# Patient Record
Sex: Female | Born: 1970 | Race: Black or African American | Hispanic: No | Smoking: Never smoker
Health system: Southern US, Community
[De-identification: ages and names within clinical notes are randomized; demographics above are authoritative.]

## PROBLEM LIST (undated history)

## (undated) DIAGNOSIS — T7840XA Allergy, unspecified, initial encounter: Secondary | ICD-10-CM

## (undated) DIAGNOSIS — D649 Anemia, unspecified: Secondary | ICD-10-CM

## (undated) HISTORY — PX: BUNIONECTOMY: SHX129

## (undated) HISTORY — DX: Anemia, unspecified: D64.9

## (undated) HISTORY — DX: Allergy, unspecified, initial encounter: T78.40XA

---

## 1998-07-21 ENCOUNTER — Other Ambulatory Visit: Admission: RE | Admit: 1998-07-21 | Discharge: 1998-07-21 | Payer: Self-pay | Admitting: Obstetrics and Gynecology

## 1999-11-01 ENCOUNTER — Other Ambulatory Visit: Admission: RE | Admit: 1999-11-01 | Discharge: 1999-11-01 | Payer: Self-pay | Admitting: *Deleted

## 2002-05-28 ENCOUNTER — Other Ambulatory Visit: Admission: RE | Admit: 2002-05-28 | Discharge: 2002-05-28 | Payer: Self-pay | Admitting: Obstetrics and Gynecology

## 2003-11-08 ENCOUNTER — Inpatient Hospital Stay (HOSPITAL_COMMUNITY): Admission: AD | Admit: 2003-11-08 | Discharge: 2003-11-11 | Payer: Self-pay | Admitting: Obstetrics and Gynecology

## 2003-12-26 ENCOUNTER — Other Ambulatory Visit: Admission: RE | Admit: 2003-12-26 | Discharge: 2003-12-26 | Payer: Self-pay | Admitting: Obstetrics and Gynecology

## 2005-02-07 ENCOUNTER — Inpatient Hospital Stay (HOSPITAL_COMMUNITY): Admission: RE | Admit: 2005-02-07 | Discharge: 2005-02-11 | Payer: Self-pay | Admitting: Obstetrics and Gynecology

## 2005-03-24 ENCOUNTER — Other Ambulatory Visit: Admission: RE | Admit: 2005-03-24 | Discharge: 2005-03-24 | Payer: Self-pay | Admitting: Obstetrics and Gynecology

## 2010-10-19 ENCOUNTER — Other Ambulatory Visit: Payer: Self-pay | Admitting: Family Medicine

## 2010-10-19 DIAGNOSIS — Z1231 Encounter for screening mammogram for malignant neoplasm of breast: Secondary | ICD-10-CM

## 2010-10-27 ENCOUNTER — Ambulatory Visit: Payer: Self-pay

## 2010-10-27 ENCOUNTER — Ambulatory Visit
Admission: RE | Admit: 2010-10-27 | Discharge: 2010-10-27 | Disposition: A | Discharge status: Home / Self Care | Payer: PRIVATE HEALTH INSURANCE | Source: Ambulatory Visit | Attending: Family Medicine | Admitting: Family Medicine

## 2010-10-27 DIAGNOSIS — Z1231 Encounter for screening mammogram for malignant neoplasm of breast: Secondary | ICD-10-CM

## 2012-08-31 ENCOUNTER — Other Ambulatory Visit: Payer: Self-pay

## 2012-10-02 ENCOUNTER — Encounter: Payer: Self-pay | Admitting: Family Medicine

## 2012-10-02 ENCOUNTER — Ambulatory Visit (INDEPENDENT_AMBULATORY_CARE_PROVIDER_SITE_OTHER): Payer: BC Managed Care – PPO | Admitting: Family Medicine

## 2012-10-02 VITALS — BP 120/76 | HR 80 | Temp 98.1°F | Resp 16 | Ht 64.0 in | Wt 174.0 lb

## 2012-10-02 DIAGNOSIS — E559 Vitamin D deficiency, unspecified: Secondary | ICD-10-CM

## 2012-10-02 DIAGNOSIS — Z1231 Encounter for screening mammogram for malignant neoplasm of breast: Secondary | ICD-10-CM

## 2012-10-02 DIAGNOSIS — Z0000 Encounter for general adult medical examination without abnormal findings: Secondary | ICD-10-CM

## 2012-10-02 LAB — POCT UA - MICROSCOPIC ONLY
Casts, Ur, LPF, POC: NEGATIVE
Crystals, Ur, HPF, POC: NEGATIVE
Mucus, UA: NEGATIVE
Yeast, UA: NEGATIVE

## 2012-10-02 LAB — POCT URINALYSIS DIPSTICK
Glucose, UA: NEGATIVE
Ketones, UA: NEGATIVE
Protein, UA: NEGATIVE
Spec Grav, UA: 1.01
Urobilinogen, UA: 0.2

## 2012-10-02 LAB — COMPREHENSIVE METABOLIC PANEL
ALT: 9 U/L (ref 0–35)
Albumin: 4.1 g/dL (ref 3.5–5.2)
Alkaline Phosphatase: 49 U/L (ref 39–117)
Glucose, Bld: 88 mg/dL (ref 70–99)
Potassium: 4.1 mEq/L (ref 3.5–5.3)
Sodium: 138 mEq/L (ref 135–145)
Total Bilirubin: 0.5 mg/dL (ref 0.3–1.2)
Total Protein: 7.1 g/dL (ref 6.0–8.3)

## 2012-10-02 NOTE — Patient Instructions (Addendum)
Keeping You Healthy  Get These Tests 1. Blood Pressure- Have your blood pressure checked once a year by your health care provider.  Normal blood pressure is 120/80. 2. Weight- Have your body mass index (BMI) calculated to screen for obesity.  BMI is measure of body fat based on height and weight.  You can also calculate your own BMI at https://www.west-esparza.com/. 3. Cholesterol- Have your cholesterol checked every 5 years starting at age 42 then yearly starting at age 72. 4. Chlamydia, HIV, and other sexually transmitted diseases- Get screened every year until age 30, then within three months of each new sexual provider. 5. Pap Smear- Every 1-3 years; discuss with your health care provider. 6. Mammogram- Every year starting at age 89  Take these medicines  Calcium with Vitamin D-Your body needs 1200 mg of Calcium each day and [phone] IU of Vitamin D daily.  Your body can only absorb 500 mg of Calcium at a time so Calcium must be taken in 2 or 3 divided doses throughout the day.  Multivitamin with folic acid- Once daily if it is possible for you to become pregnant.  Get these Immunizations  Gardasil-Series of three doses; prevents HPV related illness such as genital warts and cervical cancer.  Menactra-Single dose; prevents meningitis.  Tetanus shot- Every 10 years.  Check with your GYN provider to see if you have had Tdap vaccine (this is a one-time immunization recommended for all adults).  Flu shot-Every year.  Take these steps 1. Do not smoke-Your healthcare provider can help you quit.  For tips on how to quit go to www.smokefree.gov or call 1-800 QUITNOW. 2. Be physically active- Exercise 5 days a week for at least 30 minutes.  If you are not already physically active, start slow and gradually work up to 30 minutes of moderate physical activity.  Examples of moderate activity include walking briskly, dancing, swimming, bicycling, etc. 3. Breast Cancer- A self breast exam every month  is important for early detection of breast cancer.  For more information and instruction on self breast exams, ask your healthcare provider or [email or web address]. 4. Eat a healthy diet- Eat a variety of healthy foods such as fruits, vegetables, whole grains, low fat milk, low fat cheeses, yogurt, lean meats, poultry and fish, beans, nuts, tofu, etc.  For more information go to www. Thenutritionsource.org 5. Drink alcohol in moderation- Limit alcohol intake to one drink or less per day. Never drink and drive. 6. Depression- Your emotional health is as important as your physical health.  If you're feeling down or losing interest in things you normally enjoy please talk to your healthcare provider about being screened for depression. 7. Dental visit- Brush and floss your teeth twice daily; visit your dentist twice a year. 8. Eye doctor- Get an eye exam at least every 2 years. 9. Helmet use- Always wear a helmet when riding a bicycle, motorcycle, rollerblading or skateboarding. 10. Safe sex- If you may be exposed to sexually transmitted infections, use a condom. 11. Seat belts- Seat belts can save your live; always wear one. 12. Smoke/Carbon Monoxide detectors- These detectors need to be installed on the appropriate level of your home. Replace batteries at least once a year. 13. Skin cancer- When out in the sun please cover up and use sunscreen 15 SPF or higher. 14. Violence- If anyone is threatening or hurting you, please tell your healthcare provider.   For allergy symptoms- get over-the-counter Allergy-D (generic is fine) or Claritin-D and take this medication  every evening. If this is not effective, try Nasacort Nasal Spray (now available without prescription) and spray once or twice in each nostril at bedtime.  Allergic Rhinitis Allergic rhinitis is when the mucous membranes in the nose respond to allergens. Allergens are particles in the air that cause your body to have  an allergic reaction. This causes you to release allergic antibodies. Through a chain of events, these eventually cause you to release histamine into the blood stream (hence the use of antihistamines). Although meant to be protective to the body, it is this release that causes your discomfort, such as frequent sneezing, congestion and an itchy runny nose.  CAUSES  The pollen allergens may come from grasses, trees, and weeds. This is seasonal allergic rhinitis, or "hay fever." Other allergens cause year-round allergic rhinitis (perennial allergic rhinitis) such as house dust mite allergen, pet dander and mold spores.  SYMPTOMS   Nasal stuffiness (congestion).  Runny, itchy nose with sneezing and tearing of the eyes.  There is often an itching of the mouth, eyes and ears. It cannot be cured, but it can be controlled with medications. DIAGNOSIS  If you are unable to determine the offending allergen, skin or blood testing may find it. TREATMENT   Avoid the allergen.  Medications and allergy shots (immunotherapy) can help.  Hay fever may often be treated with antihistamines in pill or nasal spray forms. Antihistamines block the effects of histamine. There are over-the-counter medicines that may help with nasal congestion and swelling around the eyes. Check with your caregiver before taking or giving this medicine. If the treatment above does not work, there are many new medications your caregiver can prescribe. Stronger medications may be used if initial measures are ineffective. Desensitizing injections can be used if medications and avoidance fails. Desensitization is when a patient is given ongoing shots until the body becomes less sensitive to the allergen. Make sure you follow up with your caregiver if problems continue. SEEK MEDICAL CARE IF:   You develop fever (more than 100.5 F (38.1 C).  You develop a cough that does not stop easily (persistent).  You have shortness of breath.  You  start wheezing.  Symptoms interfere with normal daily activities. Document Released: 03/15/2001 Document Revised: 09/12/2011 Document Reviewed: 09/24/2008 [facility] Patient Information 2013 [place], [place].

## 2012-10-02 NOTE — Progress Notes (Signed)
Subjective:    Patient ID: Chelsea Mason, female    DOB: [birth year], 42 y.o.   MRN: [id number]  HPI  This 42 y.o. AA female is here for CPE; she has a provider for GYN care. She enjoys good   health but does report a hx of Vit D deficiency that required high dose supplementation.  She is  not currently taking any OTC dietary supplements.    Review of Systems  Constitutional: Negative.   HENT: Positive for sinus pressure and tinnitus. Negative for congestion.   Eyes: Negative.        [facility] eye care at [facility].  All other systems reviewed and are negative.       Objective:   Physical Exam  Nursing note and vitals reviewed. Constitutional: She is oriented to person, place, and time. Vital signs are normal. She appears well-developed and well-nourished. No distress.  HENT:  Head: Normocephalic and atraumatic.  Right Ear: Hearing, tympanic membrane, external ear and ear canal normal. Tympanic membrane is not injected, not scarred, not erythematous and not bulging. No middle ear effusion.  Left Ear: Hearing, external ear and ear canal normal. No tenderness. Tympanic membrane is not injected, not scarred, not erythematous and not bulging. A middle ear effusion is present.  Nose: Mucosal edema present. No rhinorrhea, nasal deformity or septal deviation. Right sinus exhibits no maxillary sinus tenderness and no frontal sinus tenderness. Left sinus exhibits no maxillary sinus tenderness and no frontal sinus tenderness.  Mouth/Throat: Uvula is midline and mucous membranes are normal. No oral lesions. Normal dentition. No dental caries. Posterior oropharyngeal erythema present. No oropharyngeal exudate.  Eyes: Conjunctivae, EOM and lids are normal. Pupils are equal, round, and reactive to light. No scleral icterus.  Neck: Normal range of motion. Neck supple. No thyromegaly present.  Cardiovascular: Normal rate, regular rhythm, normal heart sounds and intact distal pulses.  Exam  reveals no gallop and no friction rub.   No murmur heard. Pulmonary/Chest: Effort normal and breath sounds normal. No respiratory distress. Right breast exhibits no inverted nipple, no mass, no nipple discharge, no skin change and no tenderness. Left breast exhibits no inverted nipple, no mass, no nipple discharge, no skin change and no tenderness. Breasts are symmetrical.  Breast tissue is dense (L>R); no discrete masses.  Abdominal: Soft. Bowel sounds are normal. She exhibits no distension and no mass. There is no hepatosplenomegaly. There is no tenderness. There is no guarding and no CVA tenderness.  Musculoskeletal: Normal range of motion. She exhibits no edema and no tenderness.  Lymphadenopathy:    She has no cervical adenopathy.  Neurological: She is alert and oriented to person, place, and time. She has normal reflexes. No cranial nerve deficit. She exhibits normal muscle tone. Coordination normal.  Skin: Skin is warm and dry. No rash noted. No erythema.  Psychiatric: She has a normal mood and affect. Her behavior is normal. Judgment and thought content normal.     Results for orders placed in visit on 10/02/12  POCT URINALYSIS DIPSTICK      Result Value Range   Color, UA yellow     Clarity, UA clear     Glucose, UA neg     Bilirubin, UA neg     Ketones, UA neg     Spec Grav, UA 1.010     Blood, UA neg     pH, UA 6.5     Protein, UA neg     Urobilinogen, UA 0.2  Nitrite, UA neg     Leukocytes, UA moderate (2+)    POCT UA - MICROSCOPIC ONLY      Result Value Range   WBC, Ur, HPF, POC 0-6     RBC, urine, microscopic 0-2     Bacteria, U Microscopic neg     Mucus, UA neg     Epithelial cells, urine per micros 0-6     Crystals, Ur, HPF, POC neg     Casts, Ur, LPF, POC neg     Yeast, UA neg         Assessment & Plan:  Routine general medical examination at a health care facility - improve nutrition and fitness routine. Plan: POCT urinalysis dipstick, Comprehensive  metabolic panel, POCT UA - Microscopic Only  Unspecified vitamin D deficiency - Plan: Vitamin D, 25-hydroxy; advised OTC MVI and Vit D3 1000 IU daily.   Other screening mammogram - Plan: MM Digital Screening

## 2012-10-03 ENCOUNTER — Encounter: Payer: Self-pay | Admitting: Family Medicine

## 2012-10-04 MED ORDER — ERGOCALCIFEROL 1.25 MG (50000 UT) PO CAPS: 50000.0000 [IU] | ORAL_CAPSULE | ORAL | Status: DC

## 2012-10-04 NOTE — Addendum Note (Signed)
Addended by: Dow Adolph B on: 10/04/2012 06:55 PM   Modules accepted: Orders

## 2012-10-04 NOTE — Progress Notes (Signed)
Quick Note:  Please contact pt and advise that the following labs are abnormal... Vitamin D level is quite low; I have prescribed Vitamin D 21308 units 1 capsule once a week. Wynelle Link exposure most days of the week is important as well as eating more Vitamin D- rich foods like salmon, tuna and sardines, mushrooms, eggs and some dairy products.  Other labs are normal chemistries, kidney and liver tests.  Copy to pt. ______

## 2012-10-30 ENCOUNTER — Ambulatory Visit
Admission: RE | Admit: 2012-10-30 | Discharge: 2012-10-30 | Disposition: A | Discharge status: Home / Self Care | Payer: BC Managed Care – PPO | Source: Ambulatory Visit | Attending: Family Medicine | Admitting: Family Medicine

## 2012-10-30 DIAGNOSIS — Z1231 Encounter for screening mammogram for malignant neoplasm of breast: Secondary | ICD-10-CM

## 2012-10-30 IMAGING — MG MM DIGITAL SCREENING BILAT
4 series · 4 of 4 positions shown · non-contrast
Comparison: Previous exams.

CLINICAL DATA: Screening.

DIGITAL BILATERAL SCREENING MAMMOGRAM WITH CAD

[R MLO]
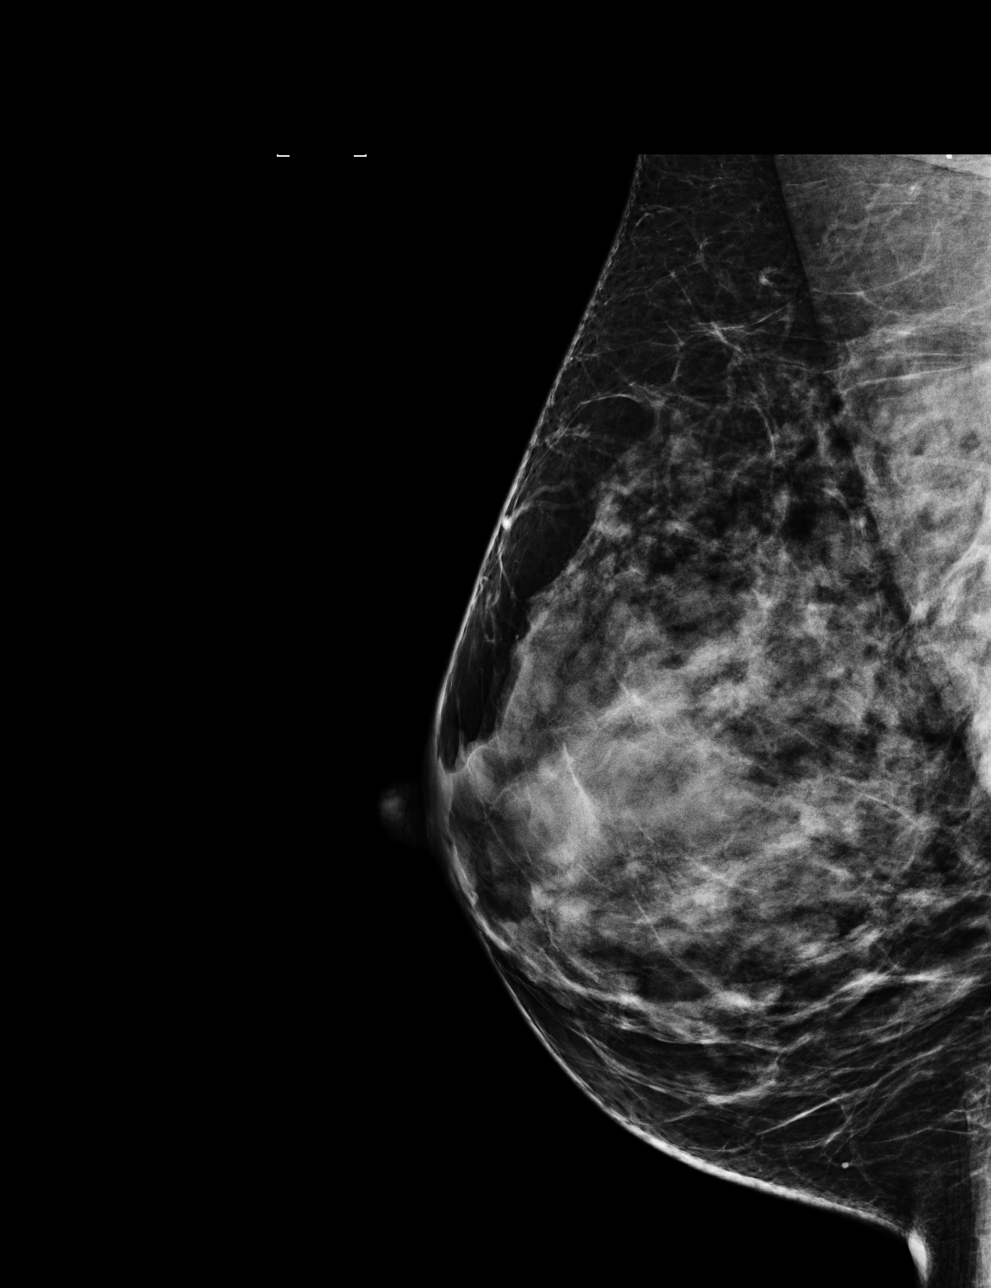

[R CC]
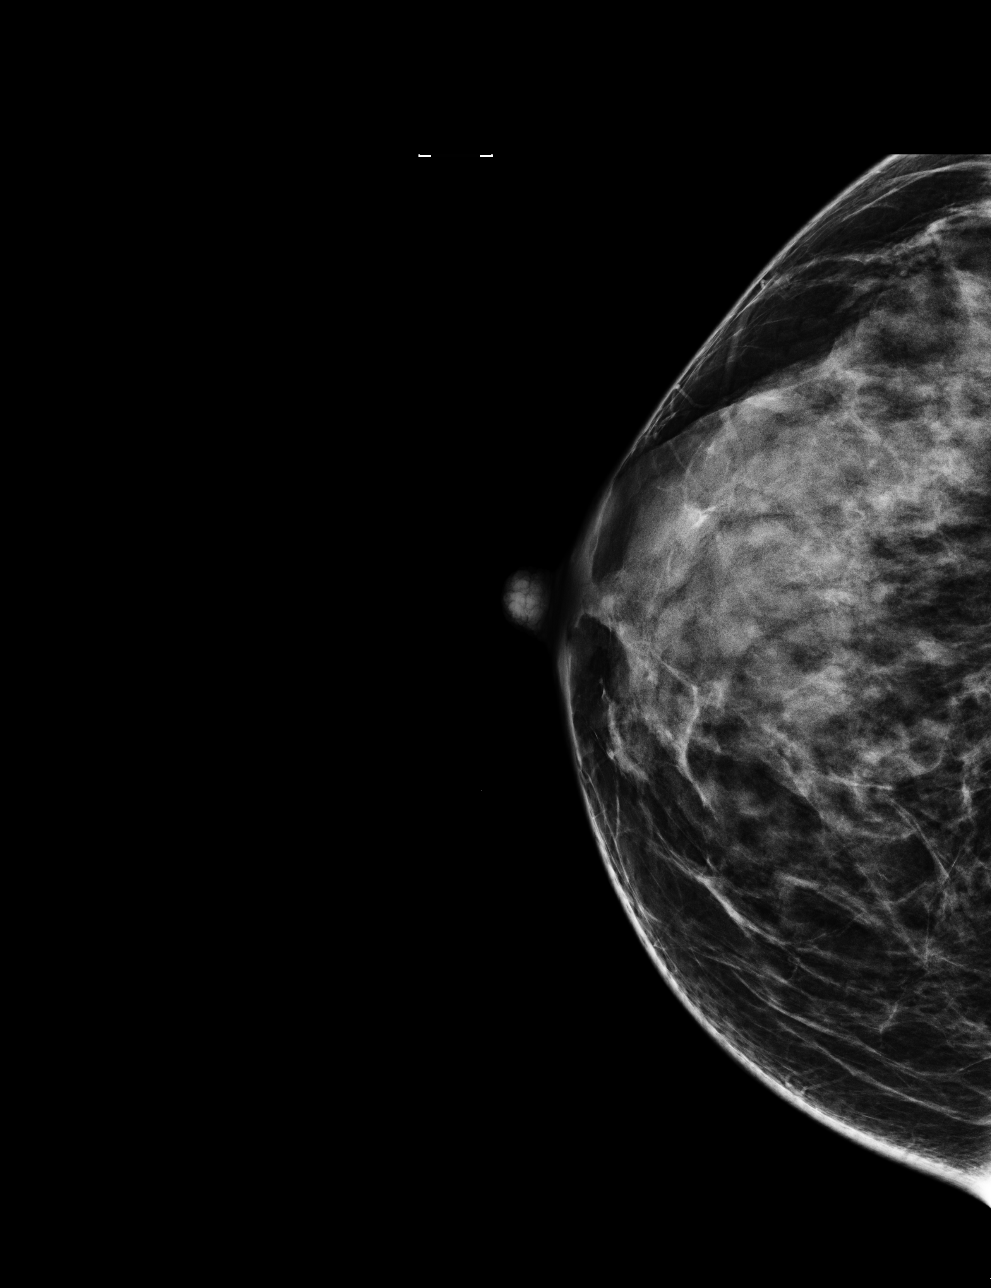

[L CC]
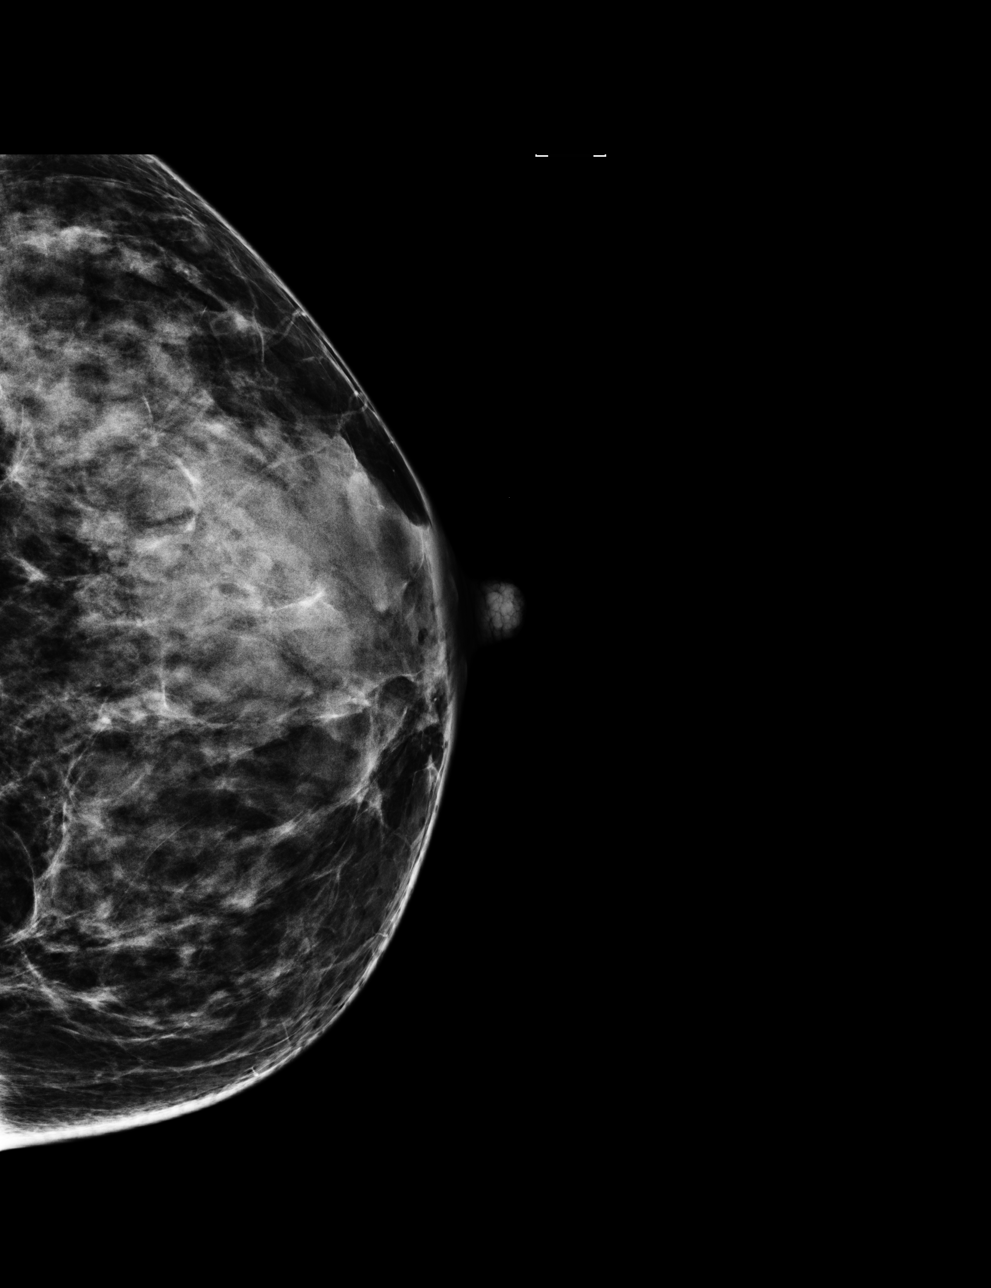

[L MLO]
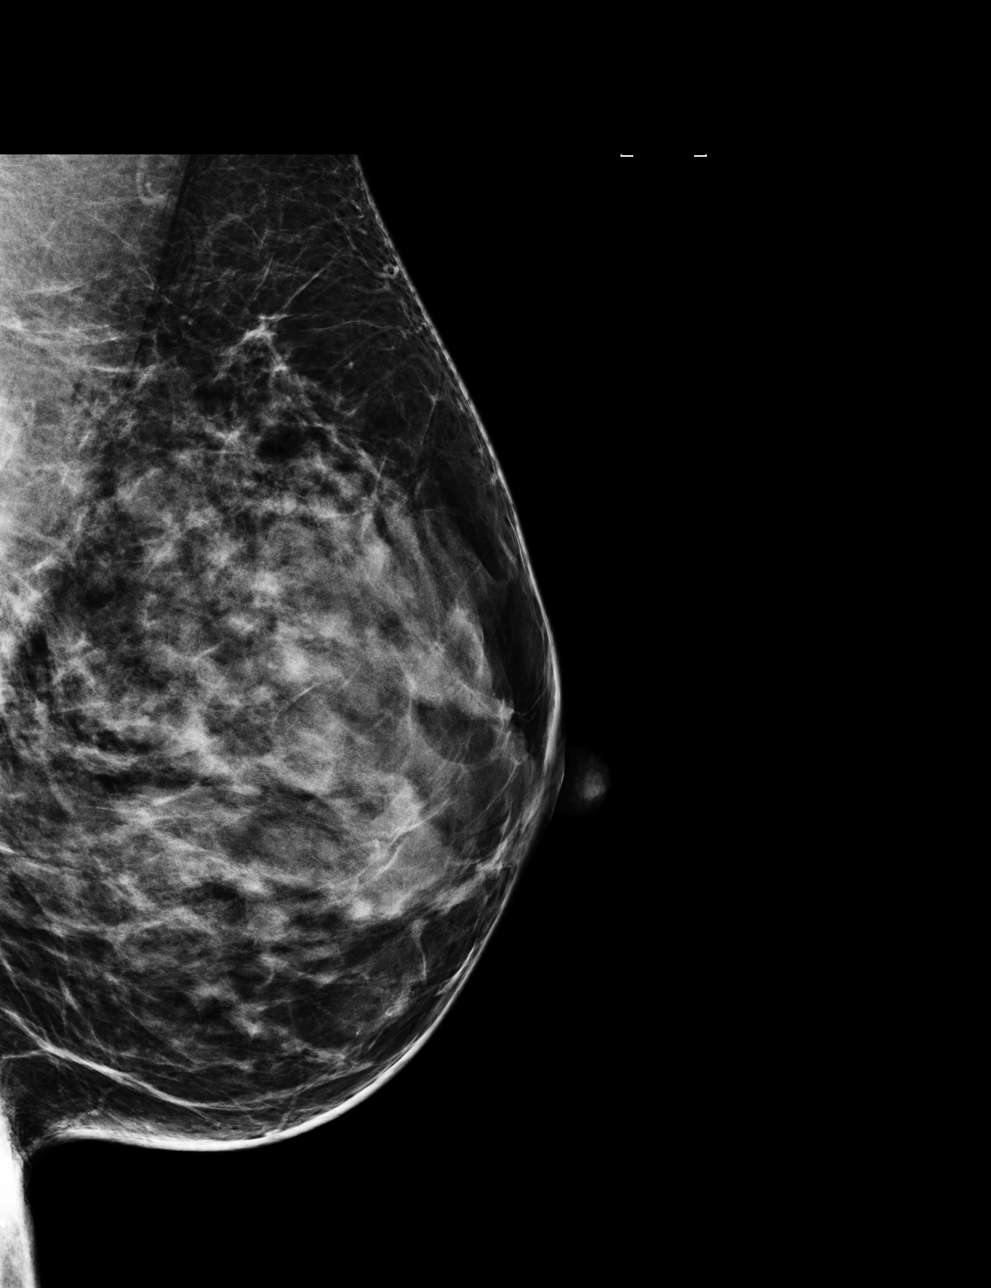

[4 of 4 positions shown; findings below may reference images not displayed]

FINDINGS: ACR Breast Density Category 3: The breast tissue is heterogeneously
dense.

No suspicious masses, architectural distortion, or calcifications
are present.

Images were processed with CAD.
IMPRESSION: No mammographic evidence of malignancy.

A result letter of this screening mammogram will be mailed directly
to the patient.

RECOMMENDATION:
Screening mammogram in one year. (Code:S5-2-VZT)

BI-RADS CATEGORY 1:  Negative.

## 2013-10-11 ENCOUNTER — Other Ambulatory Visit: Payer: Self-pay

## 2013-10-11 DIAGNOSIS — Z1231 Encounter for screening mammogram for malignant neoplasm of breast: Secondary | ICD-10-CM

## 2013-10-23 ENCOUNTER — Encounter: Payer: Self-pay | Admitting: Family Medicine

## 2013-10-23 ENCOUNTER — Ambulatory Visit (INDEPENDENT_AMBULATORY_CARE_PROVIDER_SITE_OTHER): Payer: BC Managed Care – PPO | Admitting: Family Medicine

## 2013-10-23 VITALS — BP 122/80 | HR 88 | Temp 98.3°F | Resp 16 | Ht 64.25 in | Wt 181.0 lb

## 2013-10-23 DIAGNOSIS — E559 Vitamin D deficiency, unspecified: Secondary | ICD-10-CM

## 2013-10-23 DIAGNOSIS — M21612 Bunion of left foot: Secondary | ICD-10-CM

## 2013-10-23 DIAGNOSIS — M21611 Bunion of right foot: Secondary | ICD-10-CM | POA: Insufficient documentation

## 2013-10-23 DIAGNOSIS — Z13220 Encounter for screening for lipoid disorders: Secondary | ICD-10-CM

## 2013-10-23 DIAGNOSIS — Z0000 Encounter for general adult medical examination without abnormal findings: Secondary | ICD-10-CM

## 2013-10-23 DIAGNOSIS — Z23 Encounter for immunization: Secondary | ICD-10-CM

## 2013-10-23 DIAGNOSIS — M21619 Bunion of unspecified foot: Secondary | ICD-10-CM

## 2013-10-23 DIAGNOSIS — J309 Allergic rhinitis, unspecified: Secondary | ICD-10-CM

## 2013-10-23 DIAGNOSIS — Z130 Encounter for screening for diseases of the blood and blood-forming organs and certain disorders involving the immune mechanism: Secondary | ICD-10-CM

## 2013-10-23 LAB — POCT URINALYSIS DIPSTICK
BILIRUBIN UA: NEGATIVE
GLUCOSE UA: NEGATIVE
Ketones, UA: NEGATIVE
LEUKOCYTES UA: NEGATIVE
NITRITE UA: NEGATIVE
Protein, UA: NEGATIVE
Spec Grav, UA: 1.015
Urobilinogen, UA: 1
pH, UA: 7

## 2013-10-23 LAB — BASIC METABOLIC PANEL
BUN: 10 mg/dL (ref 6–23)
CO2: 27 mEq/L (ref 19–32)
Calcium: 8.9 mg/dL (ref 8.4–10.5)
Chloride: 103 mEq/L (ref 96–112)
Creat: 0.76 mg/dL (ref 0.50–1.10)
Glucose, Bld: 92 mg/dL (ref 70–99)
POTASSIUM: 4.4 meq/L (ref 3.5–5.3)
SODIUM: 137 meq/L (ref 135–145)

## 2013-10-23 LAB — CBC WITH DIFFERENTIAL/PLATELET
BASOS PCT: 1 % (ref 0–1)
Basophils Absolute: 0.1 10*3/uL (ref 0.0–0.1)
EOS ABS: 0.1 10*3/uL (ref 0.0–0.7)
EOS PCT: 1 % (ref 0–5)
HCT: 38.3 % (ref 36.0–46.0)
HEMOGLOBIN: 12.4 g/dL (ref 12.0–15.0)
LYMPHS ABS: 1.1 10*3/uL (ref 0.7–4.0)
Lymphocytes Relative: 18 % (ref 12–46)
MCH: 29 pg (ref 26.0–34.0)
MCHC: 32.4 g/dL (ref 30.0–36.0)
MCV: 89.7 fL (ref 78.0–100.0)
MONOS PCT: 10 % (ref 3–12)
Monocytes Absolute: 0.6 10*3/uL (ref 0.1–1.0)
Neutro Abs: 4.2 10*3/uL (ref 1.7–7.7)
Neutrophils Relative %: 70 % (ref 43–77)
PLATELETS: 227 10*3/uL (ref 150–400)
RBC: 4.27 MIL/uL (ref 3.87–5.11)
RDW: 12.8 % (ref 11.5–15.5)
WBC: 6 10*3/uL (ref 4.0–10.5)

## 2013-10-23 LAB — LIPID PANEL
CHOL/HDL RATIO: 2.8 ratio
Cholesterol: 127 mg/dL (ref 0–200)
HDL: 46 mg/dL (ref >39)
LDL Cholesterol: 71 mg/dL (ref 0–99)
Triglycerides: 49 mg/dL (ref <150)
VLDL: 10 mg/dL (ref 0–40)

## 2013-10-23 MED ORDER — DESLORATADINE 5 MG PO TABS: 5.0000 mg | ORAL_TABLET | Freq: Every day | ORAL | Status: DC

## 2013-10-23 NOTE — Patient Instructions (Addendum)
Keeping You Healthy  Get These Tests 1. Blood Pressure- Have your blood pressure checked once a year by your health care provider.  Normal blood pressure is 120/80. 2. Weight- Have your body mass index (BMI) calculated to screen for obesity.  BMI is measure of body fat based on height and weight.  You can also calculate your own BMI at www.nhlbisupport.com/bmi/. 3. Cholesterol- Have your cholesterol checked every 5 years starting at age 43 then yearly starting at age 45. 4. Chlamydia, HIV, and other sexually transmitted diseases- Get screened every year until age 25, then within three months of each new sexual provider. 5. Pap Smear- Every 1-3 years; discuss with your health care provider. 6. Mammogram- Every year starting at age 40  Take these medicines  Calcium with Vitamin D-Your body needs 1200 mg of Calcium each day and 800-1000 IU of Vitamin D daily.  Your body can only absorb 500 mg of Calcium at a time so Calcium must be taken in 2 or 3 divided doses throughout the day.  Multivitamin with folic acid- Once daily if it is possible for you to become pregnant.  Get these Immunizations  Gardasil-Series of three doses; prevents HPV related illness such as genital warts and cervical cancer.  Menactra-Single dose; prevents meningitis.  Tetanus shot- Every 10 years.  Flu shot-Every year.  Take these steps 1. Do not smoke-Your healthcare provider can help you quit.  For tips on how to quit go to www.smokefree.gov or call 1-800 QUITNOW. 2. Be physically active- Exercise 5 days a week for at least 30 minutes.  If you are not already physically active, start slow and gradually work up to 30 minutes of moderate physical activity.  Examples of moderate activity include walking briskly, dancing, swimming, bicycling, etc. 3. Breast Cancer- A self breast exam every month is important for early detection of breast cancer.  For more information and instruction on self breast exams, ask your  healthcare provider or www.womenshealth.gov/faq/breast-self-exam.cfm. 4. Eat a healthy diet- Eat a variety of healthy foods such as fruits, vegetables, whole grains, low fat milk, low fat cheeses, yogurt, lean meats, poultry and fish, beans, nuts, tofu, etc.  For more information go to www. Thenutritionsource.org 5. Drink alcohol in moderation- Limit alcohol intake to one drink or less per day. Never drink and drive. 6. Depression- Your emotional health is as important as your physical health.  If you're feeling down or losing interest in things you normally enjoy please talk to your healthcare provider about being screened for depression. 7. Dental visit- Brush and floss your teeth twice daily; visit your dentist twice a year. 8. Eye doctor- Get an eye exam at least every 2 years. 9. Helmet use- Always wear a helmet when riding a bicycle, motorcycle, rollerblading or skateboarding. 10. Safe sex- If you may be exposed to sexually transmitted infections, use a condom. 11. Seat belts- Seat belts can save your live; always wear one. 12. Smoke/Carbon Monoxide detectors- These detectors need to be installed on the appropriate level of your home. Replace batteries at least once a year. 13. Skin cancer- When out in the sun please cover up and use sunscreen 15 SPF or higher. 14. Violence- If anyone is threatening or hurting you, please tell your healthcare provider.    Allergic Rhinitis Allergic rhinitis is when the mucous membranes in the nose respond to allergens. Allergens are particles in the air that cause your body to have an allergic reaction. This causes you to release allergic antibodies.   Through a chain of events, these eventually cause you to release histamine into the blood stream. Although meant to protect the body, it is this release of histamine that causes your discomfort, such as frequent sneezing, congestion, and an itchy, runny nose.  CAUSES  Seasonal allergic rhinitis (hay fever) is  caused by pollen allergens that may come from grasses, trees, and weeds. Year-round allergic rhinitis (perennial allergic rhinitis) is caused by allergens such as house dust mites, pet dander, and mold spores.  SYMPTOMS   Nasal stuffiness (congestion).  Itchy, runny nose with sneezing and tearing of the eyes. DIAGNOSIS  Your health care provider can help you determine the allergen or allergens that trigger your symptoms. If you and your health care provider are unable to determine the allergen, skin or blood testing may be used. TREATMENT  Allergic Rhinitis does not have a cure, but it can be controlled by:  Medicines and allergy shots (immunotherapy).  Avoiding the allergen. Hay fever may often be treated with antihistamines in pill or nasal spray forms. Antihistamines block the effects of histamine. There are over-the-counter medicines that may help with nasal congestion and swelling around the eyes. Check with your health care provider before taking or giving this medicine.  If avoiding the allergen or the medicine prescribed do not work, there are many new medicines your health care provider can prescribe. Stronger medicine may be used if initial measures are ineffective. Desensitizing injections can be used if medicine and avoidance does not work. Desensitization is when a patient is given ongoing shots until the body becomes less sensitive to the allergen. Make sure you follow up with your health care provider if problems continue. HOME CARE INSTRUCTIONS It is not possible to completely avoid allergens, but you can reduce your symptoms by taking steps to limit your exposure to them. It helps to know exactly what you are allergic to so that you can avoid your specific triggers. SEEK MEDICAL CARE IF:   You have a fever.  You develop a cough that does not stop easily (persistent).  You have shortness of breath.  You start wheezing.  Symptoms interfere with normal daily  activities. Document Released: 03/15/2001 Document Revised: 04/10/2013 Document Reviewed: 02/25/2013 [employer] Patient Information 2014 [employer].  

## 2013-10-23 NOTE — Progress Notes (Signed)
Subjective:    Patient ID: Chelsea Mason, female    DOB: 05-Jul-1970, 43 y.o.   MRN: [id number]  HPI  This 43 y.o. AA female is here for CPE; she has GYN appt scheduled in June and MMG is scheduled. She feels well except for seasonal allergies and worsening of L foot bunion. This condition runs in her family.  HCM: GYN/ MMG- as above.           IMM- Needs Tdap.           Vision- Annually.           Dental- periodically.  Patient Active Problem List   Diagnosis Date Noted  . Unspecified vitamin D deficiency 10/02/2012  . Health care maintenance 10/02/2012   Prior to Admission medications   Medication Sig Start Date End Date Taking? Authorizing Provider  pseudoephedrine (SUDAFED) 30 MG tablet Take 30 mg by mouth every 4 (four) hours as needed for congestion.   Yes Historical Provider, MD          PMHx, Surg Hx, Soc and Fam Hx reviewed. Pt is divorced and has an 39 y.o. Son. 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   Review of Systems  Constitutional: Negative.   HENT: Positive for sinus pressure.   Eyes: Negative.   Respiratory: Negative.   Cardiovascular: Negative.   Gastrointestinal: Negative.   Endocrine: Negative.   Genitourinary: Negative.   Musculoskeletal: Negative.   Skin: Negative.   Allergic/Immunologic: Positive for environmental allergies.  Neurological: Negative.   Hematological: Negative.   Psychiatric/Behavioral: Negative.       Objective:   Physical Exam  Nursing note and vitals reviewed. Constitutional: She is oriented to person, place, and time. Vital signs are normal. She appears well-developed and well-nourished. No distress.  HENT:  Head: Normocephalic and atraumatic.  Right Ear: Hearing, tympanic membrane, external ear and ear canal normal.  Left Ear: Hearing, tympanic membrane, external ear and ear canal normal.  Nose: Nose normal. No mucosal edema, nasal deformity or septal deviation. Right sinus exhibits no maxillary sinus tenderness and  no frontal sinus tenderness. Left sinus exhibits no maxillary sinus tenderness and no frontal sinus tenderness.  Mouth/Throat: Uvula is midline, oropharynx is clear and moist and mucous membranes are normal. No oral lesions. Normal dentition. No dental caries. No posterior oropharyngeal erythema.  Eyes: Conjunctivae, EOM and lids are normal. Pupils are equal, round, and reactive to light. No scleral icterus.  Fundoscopic exam:      The right eye shows no arteriolar narrowing, no AV nicking and no papilledema. The right eye shows red reflex.       The left eye shows no arteriolar narrowing, no AV nicking and no papilledema. The left eye shows red reflex.  Neck: Trachea normal, normal range of motion and full passive range of motion without pain. Neck supple. No spinous process tenderness and no muscular tenderness present. No mass and no thyromegaly present.  Cardiovascular: Regular rhythm, S1 normal, S2 normal, normal heart sounds, intact distal pulses and normal pulses.   No extrasystoles are present. PMI is not displaced.  Exam reveals no gallop and no friction rub.   No murmur heard. Pulmonary/Chest: Effort normal and breath sounds normal. No respiratory distress.  Abdominal: Soft. Normal appearance and bowel sounds are normal. She exhibits no distension and no mass. There is no hepatosplenomegaly. There is no tenderness. There is no guarding and no CVA tenderness.  Genitourinary:  GYN/ breast exam deferred to GYN.  Musculoskeletal: Normal range of motion. She exhibits no edema and no tenderness.       Cervical back: Normal.       Thoracic back: Normal.       Lumbar back: Normal.  Lymphadenopathy:       Head (right side): No submental, no submandibular, no tonsillar, no preauricular, no posterior auricular and no occipital adenopathy present.       Head (left side): No submental, no submandibular, no tonsillar, no preauricular, no posterior auricular and no occipital adenopathy present.    She  has no cervical adenopathy.       Right: No inguinal and no supraclavicular adenopathy present.       Left: No inguinal and no supraclavicular adenopathy present.  Neurological: She is alert and oriented to person, place, and time. She has normal strength and normal reflexes. She displays no atrophy and no tremor. No cranial nerve deficit or sensory deficit. She exhibits normal muscle tone. Coordination and gait normal.  Reflex Scores:      Tricep reflexes are 2+ on the right side and 2+ on the left side.      Bicep reflexes are 2+ on the right side and 2+ on the left side.      Brachioradialis reflexes are 2+ on the right side and 2+ on the left side.      Patellar reflexes are 2+ on the right side and 2+ on the left side. Skin: Skin is warm, dry and intact. No ecchymosis, no lesion and no rash noted. She is not diaphoretic. No cyanosis or erythema. Nails show no clubbing.  Psychiatric: She has a normal Mason and affect. Her speech is normal and behavior is normal. Judgment and thought content normal. Cognition and memory are normal.    Results for orders placed in visit on 10/23/13  POCT URINALYSIS DIPSTICK      Result Value Ref Range   Color, UA yellow     Clarity, UA clear     Glucose, UA neg     Bilirubin, UA neg     Ketones, UA neg     Spec Grav, UA 1.015     Blood, UA trace     pH, UA 7.0     Protein, UA neg     Urobilinogen, UA 1.0     Nitrite, UA neg     Leukocytes, UA Negative        Assessment & Plan:  Routine general medical examination at a health care facility - Plan: Basic metabolic panel, POCT urinalysis dipstick  Allergic rhinitis, cause unspecified- RX: Clarinex 5 mg 1 tablet daily. Use OTC AYR saline nasal mist.  Unspecified vitamin D deficiency - Plan: Vitamin D, 25-hydroxy  Bilateral bunions - Plan: Ambulatory referral to Podiatry  Screening for hyperlipidemia - Plan: Lipid panel  Screening for deficiency anemia - Plan: CBC with Differential  Need for  prophylactic vaccination with combined diphtheria-tetanus-pertussis (DTP) vaccine - Plan: Tdap vaccine greater than or equal to 7yo IM

## 2013-10-24 LAB — VITAMIN D 25 HYDROXY (VIT D DEFICIENCY, FRACTURES): VIT D 25 HYDROXY: 18 ng/mL — AB (ref 30–89)

## 2013-10-24 NOTE — Progress Notes (Signed)
Quick Note:  Please advise pt regarding following labs... Vitamin D level is low. As before, you will need to take a once-a-week supplement which I have prescribed and routed to your pharmacy. Take this medication for the next 6 months. Try to continue with sun exposure and eating those foods that are good sources of Vitamin D.  All other labs are normal.  Copy to pt. ______

## 2013-10-29 ENCOUNTER — Other Ambulatory Visit: Payer: Self-pay | Admitting: Family Medicine

## 2013-10-31 ENCOUNTER — Encounter (INDEPENDENT_AMBULATORY_CARE_PROVIDER_SITE_OTHER): Payer: Self-pay

## 2013-10-31 ENCOUNTER — Ambulatory Visit
Admission: RE | Admit: 2013-10-31 | Discharge: 2013-10-31 | Disposition: A | Discharge status: Home / Self Care | Payer: BC Managed Care – PPO | Source: Ambulatory Visit

## 2013-10-31 DIAGNOSIS — Z1231 Encounter for screening mammogram for malignant neoplasm of breast: Secondary | ICD-10-CM

## 2013-11-11 ENCOUNTER — Ambulatory Visit (INDEPENDENT_AMBULATORY_CARE_PROVIDER_SITE_OTHER): Payer: BC Managed Care – PPO | Admitting: Podiatry

## 2013-11-11 ENCOUNTER — Encounter: Payer: Self-pay | Admitting: Podiatry

## 2013-11-11 ENCOUNTER — Ambulatory Visit (INDEPENDENT_AMBULATORY_CARE_PROVIDER_SITE_OTHER): Payer: BC Managed Care – PPO

## 2013-11-11 VITALS — BP 136/72 | HR 85 | Resp 16

## 2013-11-11 DIAGNOSIS — M2010 Hallux valgus (acquired), unspecified foot: Secondary | ICD-10-CM

## 2013-11-11 DIAGNOSIS — M2040 Other hammer toe(s) (acquired), unspecified foot: Secondary | ICD-10-CM

## 2013-11-11 NOTE — Progress Notes (Signed)
   Subjective:    Patient ID: Chelsea Mason, female    DOB: August 12, 1970, 43 y.o.   MRN: [id number]  HPI Comments: I've got the bunions"  Patient c/o aching 1st MPJ bilateral, mainly left over right, for several years. The areas get red and swollen. She has trouble with shoes. Tries to wear better shoes.  Foot Pain      Review of Systems  HENT: Positive for sinus pressure.   All other systems reviewed and are negative.      Objective:   Physical Exam        Assessment & Plan:

## 2013-11-11 NOTE — Progress Notes (Signed)
Subjective:     Patient ID: Chelsea Mason, female   DOB: January 09, 1971, 43 y.o.   MRN: [id number]  Foot Pain   patient presents stating I am getting a lot of pain around the big toe joint left over right and has been getting worse for the last couple years. I also know I have flatfeet and might fifth toe is bothering me more left over right foot. Has tried wider shoes soaks and anti-inflammatories without relief   Review of Systems  All other systems reviewed and are negative.      Objective:   Physical Exam  Nursing note and vitals reviewed. Constitutional: She is oriented to person, place, and time.  Cardiovascular: Intact distal pulses.   Musculoskeletal: Normal range of motion.  Neurological: She is oriented to person, place, and time.  Skin: Skin is warm.   neurovascular status is found to be intact with range of motion of the subtalar and midtarsal joint within normal limits and muscle strength found to be adequate. Patient is found to have a large hyperostosis medial aspect first metatarsal head left over right with redness and pain when pressed and deviation of the hallux against the second toe. Range of motion of the first MPJ is adequate with no crepitus noted. 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 Discussed conservative and surgical treatments and she has decided to have structural correction with [facility] bunionectomy left and arthroplasty fifth toe left. Explained I may not be able to get complete correction but I do think it would be very good for her and she is interested in this type of procedure were she does not need to be nonweightbearing. Scheduled for surgery in June and will reappoint for  consult

## 2013-11-11 NOTE — Patient Instructions (Signed)

## 2013-11-27 ENCOUNTER — Ambulatory Visit: Payer: BC Managed Care – PPO | Admitting: Podiatry

## 2013-11-27 ENCOUNTER — Encounter: Payer: Self-pay | Admitting: Podiatry

## 2013-11-27 ENCOUNTER — Ambulatory Visit (INDEPENDENT_AMBULATORY_CARE_PROVIDER_SITE_OTHER): Payer: BC Managed Care – PPO | Admitting: Podiatry

## 2013-11-27 VITALS — BP 135/81 | HR 99 | Resp 16

## 2013-11-27 DIAGNOSIS — M2040 Other hammer toe(s) (acquired), unspecified foot: Secondary | ICD-10-CM

## 2013-11-27 DIAGNOSIS — M2010 Hallux valgus (acquired), unspecified foot: Secondary | ICD-10-CM

## 2013-11-27 NOTE — Progress Notes (Signed)
Subjective:     Patient ID: Chelsea Mason, female   DOB: Mar 29, 1971, 43 y.o.   MRN: [id number]  HPI patient presents stating she is having surgery in June and is here to sign and go over the procedures   Review of Systems     Objective:   Physical Exam Neurovascular status intact with digital well-perfused and large hyperostosis medial aspect first metatarsal head both feet with lesion on the fifth toe both feet that are painful    Assessment:     HAV deformity left and right foot with pain and keratotic lesion fifth digit both feet that are painful    Plan:     Reviewed condition and allowed patient to be consent form for correction of the left foot. Review the surgery which will include Austin osteotomy along with hammertoe repair fifth left foot. Explained a total recovery. Takes about 6 months for procedure such as this and that all complications are possible as listed in a consent form. Patient is dispensed air fracture walker with all instructions on usage prior to surgery

## 2013-12-21 HISTORY — PX: BUNIONECTOMY: SHX129

## 2013-12-24 DIAGNOSIS — M2040 Other hammer toe(s) (acquired), unspecified foot: Secondary | ICD-10-CM

## 2013-12-24 DIAGNOSIS — M2030 Hallux varus (acquired), unspecified foot: Secondary | ICD-10-CM

## 2013-12-24 DIAGNOSIS — M2010 Hallux valgus (acquired), unspecified foot: Secondary | ICD-10-CM

## 2013-12-25 ENCOUNTER — Telehealth: Payer: Self-pay

## 2013-12-25 NOTE — Telephone Encounter (Signed)
Spoke with pt regarding post op status. She stated that her pain was manageable and she was doing well. Advised to elevate and take pain medications as schedule, remain in boot and keep sterile dressing dry

## 2013-12-30 ENCOUNTER — Encounter: Payer: Self-pay | Admitting: Podiatry

## 2013-12-30 ENCOUNTER — Ambulatory Visit (INDEPENDENT_AMBULATORY_CARE_PROVIDER_SITE_OTHER): Payer: BC Managed Care – PPO

## 2013-12-30 ENCOUNTER — Ambulatory Visit (INDEPENDENT_AMBULATORY_CARE_PROVIDER_SITE_OTHER): Payer: BC Managed Care – PPO | Admitting: Podiatry

## 2013-12-30 VITALS — BP 137/100 | HR 100 | Temp 98.3°F | Resp 16

## 2013-12-30 DIAGNOSIS — M2040 Other hammer toe(s) (acquired), unspecified foot: Secondary | ICD-10-CM

## 2013-12-30 DIAGNOSIS — Z9889 Other specified postprocedural states: Secondary | ICD-10-CM

## 2013-12-30 DIAGNOSIS — M2010 Hallux valgus (acquired), unspecified foot: Secondary | ICD-10-CM

## 2013-12-30 NOTE — Progress Notes (Signed)
Subjective:     Patient ID: Chelsea Mason, female   DOB: Jan 27, 1971, 43 y.o.   MRN: [id number]  HPI patient states that I'm doing very well on my foot and able to walk without much pain or swelling   Review of Systems     Objective:   Physical Exam Neurovascular status intact with patient 6 days after having Austin osteotomy left and Akin osteotomy left with   the wound edges well coapted and hallux in rectus position range of motion adequate and a negative Homans sign noted  Assessment:     Doing very well after having forefoot surgery left 6 days postop     Plan:     X-rays an H&P reviewed with patient sterile dressing reapplied and instructed on continued immobilization. Reappoint in 4 weeks and to assistance in 2 weeks for stitch removal of left fifth toe which is also healing very well

## 2013-12-30 NOTE — Progress Notes (Signed)
   Subjective:    Patient ID: Chelsea Mason, female    DOB: Feb 08, 1971, 43 y.o.   MRN: [id number]  HPI Pt is post op left Liane Comber and hammertoe repair 5th met. DOS 12/24/13. States that her pain has been manageable, and she has been keeping foot elevated and using ice prn   Review of Systems     Objective:   Physical Exam        Assessment & Plan:

## 2013-12-31 ENCOUNTER — Encounter: Payer: Self-pay | Admitting: Podiatry

## 2013-12-31 NOTE — Progress Notes (Signed)
Dr Paulla Dolly performed a Left Austin bunionectomy and left hammertoe repair 5th met on 12/24/13. Prescribed Demerol 53m #35 1-2 tabs Q4-6 hrs prn pain. Phnergan 256m#35 1-2 tabs Q4-6hrs prn nausea

## 2014-01-13 ENCOUNTER — Ambulatory Visit: Payer: BC Managed Care – PPO | Admitting: *Deleted

## 2014-01-13 VITALS — BP 115/68 | HR 75 | Temp 98.3°F | Resp 16

## 2014-01-13 DIAGNOSIS — Z4802 Encounter for removal of sutures: Secondary | ICD-10-CM

## 2014-01-13 NOTE — Progress Notes (Signed)
   Subjective:    Patient ID: Chelsea Mason, female    DOB: 12-19-1970, 43 y.o.   MRN: [id number]  HPI  Pt is here to have post op stitches removed on 5th left met  Review of Systems     Objective:   Physical Exam  Wound edges aligned, no erythema or warmth noted, mild swelling, no drainage. Afebrile      Assessment & Plan:  Pt is to f/u with Dr Paulla Dolly at the end of the month for routine post op evaluation, call with any questions or concerns

## 2014-01-17 ENCOUNTER — Encounter: Payer: Self-pay | Admitting: Podiatry

## 2014-01-21 ENCOUNTER — Encounter: Payer: Self-pay | Admitting: Podiatry

## 2014-01-21 ENCOUNTER — Telehealth: Payer: Self-pay | Admitting: *Deleted

## 2014-01-21 NOTE — Telephone Encounter (Signed)
I had surgery on 06/23.  My foot is healing well.  There's a place open on my foot that I would like looked at.  I'd like to be seen today or tomorrow.  I asked Jacob Moores S to call and schedule the patient an appointment for tomorrow with Dr. Paulla Dolly.

## 2014-01-22 ENCOUNTER — Ambulatory Visit (INDEPENDENT_AMBULATORY_CARE_PROVIDER_SITE_OTHER): Payer: BC Managed Care – PPO | Admitting: Podiatry

## 2014-01-22 ENCOUNTER — Ambulatory Visit (INDEPENDENT_AMBULATORY_CARE_PROVIDER_SITE_OTHER): Payer: BC Managed Care – PPO

## 2014-01-22 VITALS — BP 124/78 | HR 98 | Resp 16

## 2014-01-22 DIAGNOSIS — M21612 Bunion of left foot: Secondary | ICD-10-CM

## 2014-01-22 DIAGNOSIS — M21619 Bunion of unspecified foot: Secondary | ICD-10-CM

## 2014-01-24 NOTE — Progress Notes (Signed)
Subjective:     Patient ID: Chelsea Mason, female   DOB: 12/01/1970, 43 y.o.   MRN: [id number]  HPI patient presents stating that her left foot is doing well but she was concerned because her a small amount of breakdown that she was on an antibiotic for it she just wants to make sure everything is healing okay. Not having any pain drainage or swelling current   Review of Systems     Objective:   Physical Exam Neurovascular status intact with incision site left it's healing well with a small amount of distal area it's got crusted tissue but it is localized with no subcutaneous exposure no red drainage or swelling noted    Assessment:     Healing well with a small localized dehiscence that is healed left    Plan:     Reviewed x-rays and how good her foot looks clinically and explained that she may gradually return to soft shoes in about one week and that she will continue to wear Band-Aid on the area that is slightly open

## 2014-01-29 ENCOUNTER — Encounter: Payer: BC Managed Care – PPO | Admitting: Podiatry

## 2014-02-17 ENCOUNTER — Ambulatory Visit (INDEPENDENT_AMBULATORY_CARE_PROVIDER_SITE_OTHER): Payer: BC Managed Care – PPO

## 2014-02-17 ENCOUNTER — Encounter: Payer: Self-pay | Admitting: Podiatry

## 2014-02-17 ENCOUNTER — Ambulatory Visit (INDEPENDENT_AMBULATORY_CARE_PROVIDER_SITE_OTHER): Payer: BC Managed Care – PPO | Admitting: Podiatry

## 2014-02-17 VITALS — BP 141/85 | HR 76 | Resp 18

## 2014-02-17 DIAGNOSIS — M2010 Hallux valgus (acquired), unspecified foot: Secondary | ICD-10-CM

## 2014-02-17 DIAGNOSIS — M2012 Hallux valgus (acquired), left foot: Secondary | ICD-10-CM

## 2014-02-19 NOTE — Progress Notes (Signed)
Subjective:     Patient ID: Chelsea Mason, female   DOB: [birth year], 43 y.o.   MRN: [id number]  HPI patient presents stating that my left foot is improving but still sore at the end of the day   Review of Systems     Objective:   Physical Exam Neurovascular status intact negative Homans sign noted with well-healing surgical slight left first metatarsal with wound edges coapted well and good alignment noted    Assessment:     Healing well from forefoot surgery with excellent alignment and wound edges well coapted    Plan:     Reviewed x-rays and advised him gradual increase in shoe gear usage over the next 4 weeks with gradual increase in activity and reappoint at that time earlier if any issues should occur

## 2014-03-31 ENCOUNTER — Ambulatory Visit (INDEPENDENT_AMBULATORY_CARE_PROVIDER_SITE_OTHER): Payer: BC Managed Care – PPO | Admitting: Podiatry

## 2014-03-31 ENCOUNTER — Ambulatory Visit (INDEPENDENT_AMBULATORY_CARE_PROVIDER_SITE_OTHER): Payer: BC Managed Care – PPO

## 2014-03-31 ENCOUNTER — Ambulatory Visit: Payer: Self-pay

## 2014-03-31 ENCOUNTER — Encounter: Payer: Self-pay | Admitting: Podiatry

## 2014-03-31 VITALS — BP 132/75 | HR 94 | Resp 16

## 2014-03-31 DIAGNOSIS — M205X9 Other deformities of toe(s) (acquired), unspecified foot: Secondary | ICD-10-CM

## 2014-03-31 DIAGNOSIS — M2010 Hallux valgus (acquired), unspecified foot: Secondary | ICD-10-CM

## 2014-03-31 DIAGNOSIS — M2012 Hallux valgus (acquired), left foot: Secondary | ICD-10-CM

## 2014-04-02 NOTE — Progress Notes (Signed)
Subjective:     Patient ID: Chelsea Mason, female   DOB: 1971/02/20, 43 y.o.   MRN: [id number]  HPI patient states that her left foot is doing better and she's walking without discomfort or swelling 3 months after bunion surgery   Review of Systems     Objective:   Physical Exam  Neurovascular status intact with muscle strength adequate in range of motion within normal limits. Patient's noted to have well-healed surgical sites left first metatarsal fifth toe with wound edges well coapted and good alignment and range of motion noted    Assessment:     Doing well post surgery left foot    Plan:     Final x-rays reviewed and allow patient to return to saw shoe gear. Reappoint to recheck again as needed

## 2014-04-11 ENCOUNTER — Telehealth: Payer: Self-pay | Admitting: *Deleted

## 2014-04-11 NOTE — Telephone Encounter (Signed)
I called to see if we could move her surgery to 06/17/2014 from 06/16/2014.  She stated, "Yes that is fine, works even better for me."

## 2014-06-17 ENCOUNTER — Encounter: Payer: Self-pay | Admitting: Podiatry

## 2014-06-17 DIAGNOSIS — M2031 Hallux varus (acquired), right foot: Secondary | ICD-10-CM

## 2014-06-17 DIAGNOSIS — M2011 Hallux valgus (acquired), right foot: Secondary | ICD-10-CM

## 2014-06-17 HISTORY — PX: BUNIONECTOMY: SHX129

## 2014-06-23 ENCOUNTER — Ambulatory Visit (INDEPENDENT_AMBULATORY_CARE_PROVIDER_SITE_OTHER): Payer: BC Managed Care – PPO

## 2014-06-23 ENCOUNTER — Encounter: Payer: Self-pay | Admitting: Podiatry

## 2014-06-23 ENCOUNTER — Ambulatory Visit (INDEPENDENT_AMBULATORY_CARE_PROVIDER_SITE_OTHER): Payer: BC Managed Care – PPO | Admitting: Podiatry

## 2014-06-23 VITALS — BP 145/82 | HR 102 | Resp 16

## 2014-06-23 DIAGNOSIS — M2011 Hallux valgus (acquired), right foot: Secondary | ICD-10-CM

## 2014-06-23 NOTE — Progress Notes (Signed)
Subjective:     Patient ID: Chelsea Mason, female   DOB: 1971-03-15, 43 y.o.   MRN: [id number]  HPI patient states that I'm doing very well with my right foot and everything is in good alignment with no indications of problems and I have mild swelling but minimal discomfort   Review of Systems     Objective:   Physical Exam Neurovascular status intact negative Homans sign was noted with well coapted incision site right first metatarsal and right hallux with good alignment of the big toe and good range of motion with no indications of restriction    Assessment:     Healing well post Austin bunionectomy right and Akin osteotomy right    Plan:     Reviewed continued immobilization compression and elevation and anti-inflammatories and pain medicine as needed. Patient will be seen back to recheck 3 weeks earlier if any issues should occur and she is dispensed surgical shoe and Ace wrap at this time for compression and immobilization earlier if any issues should occur

## 2014-06-24 NOTE — Progress Notes (Signed)
Dr Paulla Dolly performed a right Austin bunion repair and a right Aiken osteotomy on 06/17/14

## 2014-07-07 ENCOUNTER — Ambulatory Visit (INDEPENDENT_AMBULATORY_CARE_PROVIDER_SITE_OTHER): Payer: Self-pay

## 2014-07-07 ENCOUNTER — Ambulatory Visit (INDEPENDENT_AMBULATORY_CARE_PROVIDER_SITE_OTHER): Payer: BLUE CROSS/BLUE SHIELD | Admitting: Podiatry

## 2014-07-07 DIAGNOSIS — Z9889 Other specified postprocedural states: Secondary | ICD-10-CM

## 2014-07-07 DIAGNOSIS — M2011 Hallux valgus (acquired), right foot: Secondary | ICD-10-CM

## 2014-07-07 NOTE — Progress Notes (Signed)
Subjective:     Patient ID: Chelsea Mason, female   DOB: [birth year], 44 y.o.   MRN: [id number]  HPI patient presents 3 weeks postop surgery of the right foot with patient walking well with mild discomfort and swelling with too much activity   Review of Systems     Objective:   Physical Exam Neurovascular status intact with muscle strength adequate and range of motion of the subtalar and midtarsal joint within normal limits. Patient is noted to have well coapted incision site and good alignment of the first metatarsal and right hallux    Assessment:     Doing well post forefoot reconstruction right    Plan:     Reviewed condition and reapplied sterile dressing and reviewed x-rays. Reappoint to recheck in 4 weeks and may gradually increase neck Timothy but continue immobilization elevation

## 2014-08-04 ENCOUNTER — Ambulatory Visit (INDEPENDENT_AMBULATORY_CARE_PROVIDER_SITE_OTHER): Payer: BLUE CROSS/BLUE SHIELD | Admitting: Podiatry

## 2014-08-04 ENCOUNTER — Ambulatory Visit (INDEPENDENT_AMBULATORY_CARE_PROVIDER_SITE_OTHER): Payer: BLUE CROSS/BLUE SHIELD

## 2014-08-04 ENCOUNTER — Encounter: Payer: Self-pay | Admitting: Podiatry

## 2014-08-04 DIAGNOSIS — Z9889 Other specified postprocedural states: Secondary | ICD-10-CM

## 2014-08-04 NOTE — Progress Notes (Signed)
Subjective:     Patient ID: Chelsea Mason, female   DOB: 08-18-1970, 44 y.o.   MRN: [id number]  HPI patient states I'm doing real well with my right foot with minimal discomfort and good alignment noted and I'm able to walk with only mild swelling   Review of Systems     Objective:   Physical Exam Neurovascular status intact with muscle strength adequate range of motion within normal limits. Patient's noted to have well positioned right hallux and right first metatarsal with incision site that's healed well and good range of motion of approximate 30 dorsi and 20 plantar flexion    Assessment:     Doing well post surgery of the first metatarsal and right hallux    Plan:     H&P and condition reviewed and at this time I recommended returning to normal shoe gear in the importance to walking on the medial side of the foot. Patient will be seen back 6 weeks earlier if any issues should occur

## 2014-09-15 ENCOUNTER — Encounter: Payer: Self-pay | Admitting: Podiatry

## 2014-09-15 ENCOUNTER — Ambulatory Visit (INDEPENDENT_AMBULATORY_CARE_PROVIDER_SITE_OTHER): Payer: BLUE CROSS/BLUE SHIELD | Admitting: Podiatry

## 2014-09-15 ENCOUNTER — Ambulatory Visit (INDEPENDENT_AMBULATORY_CARE_PROVIDER_SITE_OTHER): Payer: BLUE CROSS/BLUE SHIELD

## 2014-09-15 VITALS — BP 129/74 | HR 97

## 2014-09-15 DIAGNOSIS — Z9889 Other specified postprocedural states: Secondary | ICD-10-CM

## 2014-09-16 NOTE — Progress Notes (Signed)
Subjective:     Patient ID: Chelsea Mason, female   DOB: Mar 02, 1971, 44 y.o.   MRN: [id number]  HPI patient presents stating she's doing better with her right foot and is able to wear shoe gears with only minimal degree of swelling or discomfort. Several months after correction right and 6 months after correction of left   Review of Systems     Objective:   Physical Exam Neurovascular status intact with muscle strength adequate and range of motion within normal limits. Patient's noted to have good healing surgical site right versus MPJ and hallux with good alignment of the big toe against the second toe    Assessment:     Doing well post surgery right foot    Plan:     Reviewed condition and recommended return to normal shoe gear. I reviewed x-rays with her and she is discharged and less any issues should occur

## 2014-10-24 ENCOUNTER — Other Ambulatory Visit: Payer: Self-pay

## 2014-10-24 DIAGNOSIS — Z1231 Encounter for screening mammogram for malignant neoplasm of breast: Secondary | ICD-10-CM

## 2014-11-03 ENCOUNTER — Ambulatory Visit
Admission: RE | Admit: 2014-11-03 | Discharge: 2014-11-03 | Disposition: A | Discharge status: Home / Self Care | Payer: BLUE CROSS/BLUE SHIELD | Source: Ambulatory Visit

## 2014-11-03 DIAGNOSIS — Z1231 Encounter for screening mammogram for malignant neoplasm of breast: Secondary | ICD-10-CM

## 2014-11-04 ENCOUNTER — Ambulatory Visit: Payer: BLUE CROSS/BLUE SHIELD | Admitting: Family Medicine

## 2014-11-18 ENCOUNTER — Encounter: Payer: BLUE CROSS/BLUE SHIELD | Admitting: Family Medicine

## 2014-11-28 ENCOUNTER — Encounter: Payer: Self-pay | Admitting: Family Medicine

## 2014-11-28 ENCOUNTER — Ambulatory Visit (INDEPENDENT_AMBULATORY_CARE_PROVIDER_SITE_OTHER): Payer: BLUE CROSS/BLUE SHIELD | Admitting: Family Medicine

## 2014-11-28 VITALS — BP 124/85 | HR 83 | Temp 98.8°F | Resp 16 | Ht 65.0 in | Wt 199.8 lb

## 2014-11-28 DIAGNOSIS — Z0000 Encounter for general adult medical examination without abnormal findings: Secondary | ICD-10-CM | POA: Diagnosis not present

## 2014-11-28 DIAGNOSIS — E559 Vitamin D deficiency, unspecified: Secondary | ICD-10-CM

## 2014-11-28 DIAGNOSIS — Z01419 Encounter for gynecological examination (general) (routine) without abnormal findings: Secondary | ICD-10-CM

## 2014-11-28 NOTE — Patient Instructions (Signed)

## 2014-11-28 NOTE — Progress Notes (Signed)
Subjective:    Patient ID: Chelsea Mason, female    DOB: [birth year], 44 y.o.   MRN: [id number]  HPI  This 44 y.o female is here for CPE; she has PAP/pelvic at [facility] practice. MMG scheduled through that practice also. IMM are current; vision care w/ eye professional and pt has routine dental care.    Patient Active Problem List   Diagnosis Date Noted  . Bilateral bunions 10/23/2013  . Allergic rhinitis, cause unspecified 10/23/2013  . Unspecified vitamin D deficiency 10/02/2012  . Health care maintenance 10/02/2012    Prior to Admission medications   Medication Sig Start Date End Date Taking? Authorizing Provider  ibuprofen (ADVIL,MOTRIN) 200 MG tablet Take 200 mg by mouth.   Yes Historical Provider, MD  pseudoephedrine (SUDAFED) 30 MG tablet Take 30 mg by mouth every 4 (four) hours as needed for congestion.   Yes Historical Provider, MD  MEPERITAB 50 MG tablet  12/24/13   Historical Provider, MD  promethazine (PHENERGAN) 25 MG tablet  12/24/13   Historical Provider, MD  Vitamin D, Ergocalciferol, (DRISDOL) 50000 UNITS CAPS capsule TAKE ONE CAPSULE BY MOUTH ONCE A WEEK Patient not taking: Reported on 09/15/2014    Barton Fanny, MD    Past Surgical History  Procedure Laterality Date  . Cesarean section      1) 11/2003  2)02/2005  . Bunionectomy    . Bunionectomy Left 12/21/2013    Dr. Craig Staggers [place]  . Bunionectomy Right 06/17/2014    Dr. Ila Mcgill    History   Social History  . Marital Status: Married    Spouse Name: N/A  . Number of Children: N/A  . Years of Education: N/A   Occupational History  . 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 Smoking status: Never Smoker   . Smokeless tobacco: Not on file  . Alcohol Use: No  . Drug Use: No  . Sexual Activity: Yes   Other Topics Concern  . Not on file   Social History Narrative    Family History  Problem Relation Age of Onset  . Hypertension Mother   . Heart disease Maternal  Grandfather   . Stroke Maternal Grandfather   . Diabetes Paternal Grandmother     Review of Systems  Constitutional: Negative.   HENT: Positive for sinus pressure.   Eyes: Negative.   Respiratory: Negative.   Cardiovascular: Negative.   Gastrointestinal: Negative.   Endocrine: Negative.   Genitourinary: Negative.   Musculoskeletal: Negative.   Skin: Negative.   Allergic/Immunologic: Positive for environmental allergies.  Neurological: Negative.   Hematological: Negative.   Psychiatric/Behavioral: Negative.       Objective:   Physical Exam  Constitutional: She is oriented to person, place, and time. Vital signs are normal. She appears well-developed and well-nourished. No distress.  Blood pressure 124/85, pulse 83, temperature 98.8 F (37.1 C), temperature source Oral, resp. rate 16, height 5\' 5"  (1.651 m), weight 199 lb 12.8 oz (90.629 kg), last menstrual period 11/03/2014, SpO2 99 %.   HENT:  Head: Normocephalic and atraumatic.  Right Ear: Hearing, tympanic membrane, external ear and ear canal normal.  Left Ear: Hearing, tympanic membrane, external ear and ear canal normal.  Nose: Nose normal. No mucosal edema, nasal deformity or septal deviation.  Mouth/Throat: Uvula is midline and oropharynx is clear and moist. No oral lesions. Normal dentition.  Eyes: Conjunctivae and EOM are normal. Pupils are equal, round, and reactive to light. No scleral icterus.  Neck: Trachea normal, normal range of motion, full passive range of motion without pain and phonation normal. Neck supple. No spinous process tenderness and no muscular tenderness present. No thyroid mass and no thyromegaly present.  Cardiovascular: Normal rate, regular rhythm, S1 normal, S2 normal, normal heart sounds, intact distal pulses and normal pulses.   No extrasystoles are present. PMI is not displaced.  Exam reveals no gallop and no friction rub.   No murmur heard. Pulmonary/Chest: Effort normal and breath sounds  normal. No respiratory distress. She has no decreased breath sounds. She has no wheezes. She has no rhonchi.  Abdominal: Soft. Normal appearance and bowel sounds are normal. She exhibits no distension and no mass. There is no hepatosplenomegaly. There is no tenderness. There is no guarding and no CVA tenderness.  Genitourinary:  Deferred.  Musculoskeletal:       Cervical back: Normal.       Thoracic back: Normal.       Lumbar back: Normal.  Remainder of exam unremarkable.  Lymphadenopathy:       Head (right side): No submental, no submandibular, no tonsillar, no preauricular, no posterior auricular and no occipital adenopathy present.       Head (left side): No submental, no submandibular, no tonsillar, no preauricular, no posterior auricular and no occipital adenopathy present.    She has no cervical adenopathy.       Right: No inguinal and no supraclavicular adenopathy present.       Left: No inguinal and no supraclavicular adenopathy present.  Neurological: She is alert and oriented to person, place, and time. She has normal strength and normal reflexes. She displays no atrophy. No cranial nerve deficit or sensory deficit. She exhibits normal muscle tone. Coordination and gait normal.  Skin: Skin is warm, dry and intact. No ecchymosis, no lesion and no rash noted. She is not diaphoretic. No cyanosis or erythema. Nails show no clubbing.  Psychiatric: She has a normal Mason and affect. Her speech is normal and behavior is normal. Judgment and thought content normal. Cognition and memory are normal.  Nursing note and vitals reviewed.      Assessment & Plan:  Well woman exam - Plan: Thyroid Panel With TSH, COMPLETE METABOLIC PANEL WITH GFR  Vitamin D deficiency - Resume weekly Vitamin D supplement pending lab result. Plan: Vitamin D, 25-hydroxy

## 2014-11-29 LAB — COMPLETE METABOLIC PANEL WITH GFR
ALBUMIN: 3.9 g/dL (ref 3.5–5.2)
AST: 13 U/L (ref 0–37)
Alkaline Phosphatase: 55 U/L (ref 39–117)
BILIRUBIN TOTAL: 0.5 mg/dL (ref 0.2–1.2)
BUN: 9 mg/dL (ref 6–23)
CALCIUM: 8.8 mg/dL (ref 8.4–10.5)
CO2: 24 mEq/L (ref 19–32)
CREATININE: 0.81 mg/dL (ref 0.50–1.10)
Chloride: 104 mEq/L (ref 96–112)
GFR, EST NON AFRICAN AMERICAN: 89 mL/min
Glucose, Bld: 92 mg/dL (ref 70–99)
POTASSIUM: 4 meq/L (ref 3.5–5.3)
Sodium: 141 mEq/L (ref 135–145)
Total Protein: 7 g/dL (ref 6.0–8.3)

## 2014-11-29 LAB — THYROID PANEL WITH TSH
FREE THYROXINE INDEX: 1.8 (ref 1.4–3.8)
T3 Uptake: 26 % (ref 22–35)
T4 TOTAL: 7.1 ug/dL (ref 4.5–12.0)
TSH: 0.634 u[IU]/mL (ref 0.350–4.500)

## 2014-11-29 LAB — VITAMIN D 25 HYDROXY (VIT D DEFICIENCY, FRACTURES): Vit D, 25-Hydroxy: 16 ng/mL — ABNORMAL LOW (ref 30–100)

## 2014-12-01 ENCOUNTER — Telehealth: Payer: Self-pay | Admitting: Family Medicine

## 2014-12-01 MED ORDER — VITAMIN D (ERGOCALCIFEROL) 1.25 MG (50000 UNIT) PO CAPS: 50000.0000 [IU] | ORAL_CAPSULE | ORAL | Status: DC

## 2014-12-27 NOTE — Telephone Encounter (Signed)
Erroneous

## 2015-10-14 ENCOUNTER — Other Ambulatory Visit: Payer: Self-pay

## 2015-10-14 DIAGNOSIS — Z1231 Encounter for screening mammogram for malignant neoplasm of breast: Secondary | ICD-10-CM

## 2015-11-05 ENCOUNTER — Ambulatory Visit
Admission: RE | Admit: 2015-11-05 | Discharge: 2015-11-05 | Disposition: A | Discharge status: Home / Self Care | Payer: BLUE CROSS/BLUE SHIELD | Source: Ambulatory Visit

## 2015-11-05 DIAGNOSIS — Z1231 Encounter for screening mammogram for malignant neoplasm of breast: Secondary | ICD-10-CM

## 2015-12-11 ENCOUNTER — Ambulatory Visit (INDEPENDENT_AMBULATORY_CARE_PROVIDER_SITE_OTHER): Payer: BLUE CROSS/BLUE SHIELD | Admitting: Urgent Care

## 2015-12-11 VITALS — BP 122/72 | HR 86 | Temp 98.2°F | Resp 17 | Ht 65.5 in | Wt 202.0 lb

## 2015-12-11 DIAGNOSIS — Z0000 Encounter for general adult medical examination without abnormal findings: Secondary | ICD-10-CM

## 2015-12-11 DIAGNOSIS — E559 Vitamin D deficiency, unspecified: Secondary | ICD-10-CM

## 2015-12-11 LAB — LIPID PANEL
CHOL/HDL RATIO: 2.8 ratio (ref <=5.0)
CHOLESTEROL: 138 mg/dL (ref 125–200)
HDL: 50 mg/dL (ref >=46)
LDL Cholesterol: 75 mg/dL (ref <130)
Triglycerides: 65 mg/dL (ref <150)
VLDL: 13 mg/dL (ref <30)

## 2015-12-11 LAB — COMPLETE METABOLIC PANEL WITH GFR
ALBUMIN: 4.4 g/dL (ref 3.6–5.1)
ALK PHOS: 57 U/L (ref 33–115)
ALT: 8 U/L (ref 6–29)
AST: 12 U/L (ref 10–35)
BUN: 9 mg/dL (ref 7–25)
CALCIUM: 8.7 mg/dL (ref 8.6–10.2)
CO2: 26 mmol/L (ref 20–31)
CREATININE: 0.85 mg/dL (ref 0.50–1.10)
Chloride: 104 mmol/L (ref 98–110)
GFR, Est Non African American: 83 mL/min (ref >=60)
GLUCOSE: 97 mg/dL (ref 65–99)
Potassium: 3.8 mmol/L (ref 3.5–5.3)
Sodium: 140 mmol/L (ref 135–146)
TOTAL PROTEIN: 7.4 g/dL (ref 6.1–8.1)
Total Bilirubin: 0.5 mg/dL (ref 0.2–1.2)

## 2015-12-11 LAB — CBC
HEMATOCRIT: 39.6 % (ref 35.0–45.0)
HEMOGLOBIN: 12.4 g/dL (ref 11.7–15.5)
MCH: 28.3 pg (ref 27.0–33.0)
MCHC: 31.3 g/dL — AB (ref 32.0–36.0)
MCV: 90.4 fL (ref 80.0–100.0)
MPV: 11.3 fL (ref 7.5–12.5)
Platelets: 236 10*3/uL (ref 140–400)
RBC: 4.38 MIL/uL (ref 3.80–5.10)
RDW: 12.4 % (ref 11.0–15.0)
WBC: 8.9 10*3/uL (ref 3.8–10.8)

## 2015-12-11 LAB — TSH: TSH: 0.58 m[IU]/L

## 2015-12-11 MED ORDER — VITAMIN D (ERGOCALCIFEROL) 1.25 MG (50000 UNIT) PO CAPS: 50000.0000 [IU] | ORAL_CAPSULE | ORAL | Status: DC

## 2015-12-11 NOTE — Patient Instructions (Addendum)
Keeping You Healthy  Get These Tests 1. Blood Pressure- Have your blood pressure checked once a year by your health care provider.  Normal blood pressure is 120/80. 2. Weight- Have your body mass index (BMI) calculated to screen for obesity.  BMI is measure of body fat based on height and weight.  You can also calculate your own BMI at www.nhlbisupport.com/bmi/. 3. Cholesterol- Have your cholesterol checked every 5 years starting at age 45 then yearly starting at age 45. 4. Chlamydia, HIV, and other sexually transmitted diseases- Get screened every year until age 25, then within three months of each new sexual provider. 5. Pap Test - Every 1-5 years; discuss with your health care provider. 6. Mammogram- Every 1-2 years starting at age 40--50  Take these medicines  Calcium with Vitamin D-Your body needs 1200 mg of Calcium each day and 800-1000 IU of Vitamin D daily.  Your body can only absorb 500 mg of Calcium at a time so Calcium must be taken in 2 or 3 divided doses throughout the day.  Multivitamin with folic acid- Once daily if it is possible for you to become pregnant.  Get these Immunizations  Gardasil-Series of three doses; prevents HPV related illness such as genital warts and cervical cancer.  Menactra-Single dose; prevents meningitis.  Tetanus shot- Every 10 years.  Flu shot-Every year.  Take these steps 1. Do not smoke-Your healthcare provider can help you quit.  For tips on how to quit go to www.smokefree.gov or call 1-800 QUITNOW. 2. Be physically active- Exercise 5 days a week for at least 30 minutes.  If you are not already physically active, start slow and gradually work up to 30 minutes of moderate physical activity.  Examples of moderate activity include walking briskly, dancing, swimming, bicycling, etc. 3. Breast Cancer- A self breast exam every month is important for early detection of breast cancer.  For more information and instruction on self breast exams, ask your  healthcare provider or www.womenshealth.gov/faq/breast-self-exam.cfm. 4. Eat a healthy diet- Eat a variety of healthy foods such as fruits, vegetables, whole grains, low fat milk, low fat cheeses, yogurt, lean meats, poultry and fish, beans, nuts, tofu, etc.  For more information go to www. Thenutritionsource.org 5. Drink alcohol in moderation- Limit alcohol intake to one drink or less per day. Never drink and drive. 6. Depression- Your emotional health is as important as your physical health.  If you're feeling down or losing interest in things you normally enjoy please talk to your healthcare provider about being screened for depression. 7. Dental visit- Brush and floss your teeth twice daily; visit your dentist twice a year. 8. Eye doctor- Get an eye exam at least every 2 years. 9. Helmet use- Always wear a helmet when riding a bicycle, motorcycle, rollerblading or skateboarding. 10. Safe sex- If you may be exposed to sexually transmitted infections, use a condom. 11. Seat belts- Seat belts can save your live; always wear one. 12. Smoke/Carbon Monoxide detectors- These detectors need to be installed on the appropriate level of your home. Replace batteries at least once a year. 13. Skin cancer- When out in the sun please cover up and use sunscreen 15 SPF or higher. 14. Violence- If anyone is threatening or hurting you, please tell your healthcare provider.           IF you received an x-ray today, you will receive an invoice from [facility]. Please contact [facility] at [phone] with questions or concerns regarding your invoice.     IF you received labwork today, you will receive an invoice from [facility]. Please contact [facility] at [phone] with questions or concerns regarding your invoice.   Our billing staff will not be able to assist you with questions regarding bills from these companies.  You will be contacted with the lab  results as soon as they are available. The fastest way to get your results is to activate your My Chart account. Instructions are located on the last page of this paperwork. If you have not heard from [place] regarding the results in 2 weeks, please contact this office.     We recommend that you schedule a mammogram for breast cancer screening. Typically, you do not need a referral to do this. Please contact a local imaging center to schedule your mammogram.  [facility] - [phone]  *ask for the Radiology Department [facility] ([place]) - [phone] or [phone]  [facility] - [phone] [place] [phone] [facility] - [phone]  *ask for the [facility] - [phone]  *ask for the Radiology Department [facility] - [phone]  *ask for the [facility] - [phone]

## 2015-12-11 NOTE — Progress Notes (Signed)
MRN: [id number]  Subjective:   [name] is a 45 y.o. female presenting for annual physical exam.  Medical care team includes: PCP: Ellsworth Lennox, MD Vision: Has eye exams yearly, wears glasses. Dental: Dental cleanings every 6 months, had it done yesterday. OB/GYN: Last pap smear was 12/2014, was normal. Last mammography 11/2015, normal findings. Specialists: None.  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 Has good relationships at home. Eats healthily and tries to stay active with her kids and at work. Denies smoking cigarettes or drinking alcohol.    Chelsea Mason has Unspecified vitamin D deficiency; Health care maintenance; Bilateral bunions; and Allergic rhinitis, cause unspecified on her problem list.  Vitamin D deficiency - needs a refill. Admits non-compliance with her medication.  Bilateral bunions - s/p surgical removal. Patient has done very well since then.  Chelsea Mason has a current medication list which includes the following prescription(s): ibuprofen, pseudoephedrine, and vitamin d (ergocalciferol). She has No Known Allergies.  Chelsea Mason  has a past medical history of Allergy. Also  has past surgical history that includes Cesarean section; Bunionectomy; Bunionectomy (Left, 12/21/2013); and Bunionectomy (Right, 06/17/2014).  Her family history includes Diabetes in her paternal grandmother; Heart disease in her maternal grandfather; Hypertension in her mother; Stroke in her maternal grandfather.  Immunizations: last TDAP 10/23/2013  Review of Systems  Constitutional: Negative for fever, chills, weight loss, malaise/fatigue and diaphoresis.  HENT: Negative for congestion, ear discharge, ear pain, hearing loss, nosebleeds, sore throat and tinnitus.   Eyes: Negative for blurred vision, double vision, photophobia, pain, discharge and redness.  Respiratory: Negative for cough, shortness of breath and wheezing.   Cardiovascular: Negative for chest pain,  palpitations and leg swelling.  Gastrointestinal: Negative for nausea, vomiting, abdominal pain, diarrhea, constipation and blood in stool.  Genitourinary: Negative for dysuria, urgency, frequency, hematuria and flank pain.  Musculoskeletal: Negative for myalgias, back pain and joint pain.  Skin: Negative for itching and rash.  Neurological: Negative for dizziness, tingling, seizures, loss of consciousness, weakness and headaches.  Endo/Heme/Allergies: Negative for polydipsia.  Psychiatric/Behavioral: Negative for depression, suicidal ideas, hallucinations, memory loss and substance abuse. The patient is not nervous/anxious and does not have insomnia.    Objective:   Vitals: BP 122/72 mmHg  Pulse 86  Temp(Src) 98.2 F (36.8 C) (Oral)  Resp 17  Ht 5' 5.5" (1.664 m)  Wt 202 lb (91.627 kg)  BMI 33.09 kg/m2  SpO2 99%  LMP 12/10/2015  Physical Exam  Constitutional: She is oriented to person, place, and time. She appears well-developed and well-nourished.  HENT:  TM's intact bilaterally, no effusions or erythema. Nasal turbinates pink and moist, nasal passages patent. No sinus tenderness. Oropharynx clear, mucous membranes moist, dentition in good repair.  Eyes: Conjunctivae and EOM are normal. Pupils are equal, round, and reactive to light. Right eye exhibits no discharge. Left eye exhibits no discharge. No scleral icterus.  Neck: Normal range of motion. Neck supple. No thyromegaly present.  Cardiovascular: Normal rate, regular rhythm and intact distal pulses.  Exam reveals no gallop and no friction rub.   No murmur heard. Pulmonary/Chest: No respiratory distress. She has no wheezes. She has no rales.  Abdominal: Soft. Bowel sounds are normal. She exhibits no distension and no mass. There is no tenderness.  Musculoskeletal: Normal range of motion. She exhibits no edema or tenderness.  Lymphadenopathy:    She has no cervical adenopathy.  Neurological: She is alert and oriented to  person, place, and time.  She has normal reflexes.  Skin: Skin is warm and dry. No rash noted. No erythema. No pallor.  Psychiatric: She has a normal mood and affect.   Assessment and Plan :   1. Annual physical exam - Patient is very pleasant and medically healthy, labs pending. Discussed healthy lifestyle, diet, exercise, preventative care, vaccinations, and addressed patient's concerns.    2. Vitamin D deficiency - Restart Vitamin D, counseled on compliance.   [name], PA-C [facility] [phone] 12/11/2015  9:35 AM

## 2015-12-12 LAB — VITAMIN D 25 HYDROXY (VIT D DEFICIENCY, FRACTURES): Vit D, 25-Hydroxy: 14 ng/mL — ABNORMAL LOW (ref 30–100)

## 2015-12-16 ENCOUNTER — Ambulatory Visit (INDEPENDENT_AMBULATORY_CARE_PROVIDER_SITE_OTHER): Payer: BLUE CROSS/BLUE SHIELD | Admitting: Emergency Medicine

## 2015-12-16 VITALS — BP 124/88 | HR 128 | Temp 99.8°F | Ht 65.0 in | Wt 200.0 lb

## 2015-12-16 DIAGNOSIS — J020 Streptococcal pharyngitis: Secondary | ICD-10-CM

## 2015-12-16 DIAGNOSIS — J029 Acute pharyngitis, unspecified: Secondary | ICD-10-CM

## 2015-12-16 DIAGNOSIS — R509 Fever, unspecified: Secondary | ICD-10-CM

## 2015-12-16 DIAGNOSIS — R590 Localized enlarged lymph nodes: Secondary | ICD-10-CM

## 2015-12-16 LAB — POCT CBC
Granulocyte percent: 85 %G — AB (ref 37–80)
HEMATOCRIT: 35.7 % — AB (ref 37.7–47.9)
HEMOGLOBIN: 12.3 g/dL (ref 12.2–16.2)
Lymph, poc: 1.4 (ref 0.6–3.4)
MCH: 29.4 pg (ref 27–31.2)
MCHC: 34.4 g/dL (ref 31.8–35.4)
MCV: 85.5 fL (ref 80–97)
MID (CBC): 0.9 (ref 0–0.9)
MPV: 8.6 fL (ref 0–99.8)
POC GRANULOCYTE: 13.2 — AB (ref 2–6.9)
POC LYMPH PERCENT: 9.3 %L — AB (ref 10–50)
POC MID %: 5.7 % (ref 0–12)
Platelet Count, POC: 217 10*3/uL (ref 142–424)
RBC: 4.18 M/uL (ref 4.04–5.48)
RDW, POC: 12.8 %
WBC: 15.5 10*3/uL — AB (ref 4.6–10.2)

## 2015-12-16 LAB — POCT RAPID STREP A (OFFICE): Rapid Strep A Screen: POSITIVE — AB

## 2015-12-16 MED ORDER — CEFTRIAXONE SODIUM 1 G IJ SOLR
1.0000 g | Freq: Once | INTRAMUSCULAR | Status: AC
  Administered 2015-12-16: 1 g via INTRAMUSCULAR

## 2015-12-16 MED ORDER — AMOXICILLIN 875 MG PO TABS: 875.0000 mg | ORAL_TABLET | Freq: Two times a day (BID) | ORAL | Status: DC

## 2015-12-16 NOTE — Patient Instructions (Addendum)
IF you received an x-ray today, you will receive an invoice from [facility]. Please contact [facility] at [phone] with questions or concerns regarding your invoice.   IF you received labwork today, you will receive an invoice from [facility]. Please contact [facility] at [phone] with questions or concerns regarding your invoice.   Our billing staff will not be able to assist you with questions regarding bills from these companies.  You will be contacted with the lab results as soon as they are available. The fastest way to get your results is to activate your My Chart account. Instructions are located on the last page of this paperwork. If you have not heard from [place] regarding the results in 2 weeks, please contact this office.     Strep Throat Strep throat is a bacterial infection of the throat. Your health care provider may call the infection tonsillitis or pharyngitis, depending on whether there is swelling in the tonsils or at the back of the throat. Strep throat is most common during the cold months of the year in children who are 71-11 years of age, but it can happen during any season in people of any age. This infection is spread from person to person (contagious) through coughing, sneezing, or close contact. CAUSES Strep throat is caused by the bacteria called Streptococcus pyogenes. RISK FACTORS This condition is more likely to develop in:  People who spend time in crowded places where the infection can spread easily.  People who have close contact with someone who has strep throat. SYMPTOMS Symptoms of this condition include:  Fever or chills.   Redness, swelling, or pain in the tonsils or throat.  Pain or difficulty when swallowing.  White or yellow spots on the tonsils or throat.  Swollen, tender glands in the neck or under the jaw.  Red rash all over the body (rare). DIAGNOSIS This condition is diagnosed by  performing a rapid strep test or by taking a swab of your throat (throat culture test). Results from a rapid strep test are usually ready in a few minutes, but throat culture test results are available after one or two days. TREATMENT This condition is treated with antibiotic medicine. HOME CARE INSTRUCTIONS Medicines  Take over-the-counter and prescription medicines only as told by your health care provider.  Take your antibiotic as told by your health care provider. Do not stop taking the antibiotic even if you start to feel better.  Have family members who also have a sore throat or fever tested for strep throat. They may need antibiotics if they have the strep infection. Eating and Drinking  Do not share food, drinking cups, or personal items that could cause the infection to spread to other people.  If swallowing is difficult, try eating soft foods until your sore throat feels better.  Drink enough fluid to keep your urine clear or pale yellow. General Instructions  Gargle with a salt-water mixture 3-4 times per day or as needed. To make a salt-water mixture, completely dissolve -1 tsp of salt in 1 cup of warm water.  Make sure that all household members wash their hands well.  Get plenty of rest.  Stay home from school or work until you have been taking antibiotics for 24 hours.  Keep all follow-up visits as told by your health care provider. This is important. SEEK MEDICAL CARE IF:  The glands in your neck continue to get bigger.  You develop a rash, cough, or  earache.  You cough up a thick liquid that is Reino, yellow-brown, or bloody.  You have pain or discomfort that does not get better with medicine.  Your problems seem to be getting worse rather than better.  You have a fever. SEEK IMMEDIATE MEDICAL CARE IF:  You have new symptoms, such as vomiting, severe headache, stiff or painful neck, chest pain, or shortness of breath.  You have severe throat pain,  drooling, or changes in your voice.  You have swelling of the neck, or the skin on the neck becomes red and tender.  You have signs of dehydration, such as fatigue, dry mouth, and decreased urination.  You become increasingly sleepy, or you cannot wake up completely.  Your joints become red or painful.   This information is not intended to replace advice given to you by your health care provider. Make sure you discuss any questions you have with your health care provider.   Document Released: 06/17/2000 Document Revised: 03/11/2015 Document Reviewed: 10/13/2014 Elsevier Interactive Patient Education [employer].

## 2015-12-16 NOTE — Progress Notes (Signed)
Patient ID: Chelsea Mason, female   DOB: 26-Mar-1971, 45 y.o.   MRN: [id number]    By signing my name below, I, Essence Howell, attest that this documentation has been prepared under the direction and in the presence of Darlyne Russian, MD Electronically Signed: Ladene Artist, ED Scribe 12/16/2015 at 8:55 AM.  Chief Complaint:  Chief Complaint  Patient presents with  . Sore Throat    x4 days taking Claritan, Sudafed  . Fever    99.8 @ triage  . lymph node swelling    Right sided   HPI: Chelsea Mason is a 45 y.o. female who reports to [facility] today complaining of gradually worsening sore throat for the past 4 days. Pt suspects that she picked up an illness when she was seen in the office for a physical 5 days ago. She reports associated symptoms of right-sided cervical adenopathy first noted today, night sweats onset last night, generalized body aches, mild fever with Tmax of 99.8 F at triage today. Pt has tried ibuprofen, Claritin and Sudafed without significant relief. She denies cough. Pt also denies h/o strep throat.   Past Medical History  Diagnosis Date  . Allergy    Past Surgical History  Procedure Laterality Date  . Cesarean section      1) 11/2003  2)02/2005  . Bunionectomy    . Bunionectomy Left 12/21/2013    Dr. Craig Staggers [facility]  . Bunionectomy Right 06/17/2014    Dr. Ila Mcgill   Social History   Social History  . Marital Status: Married    Spouse Name: N/A  . Number of Children: N/A  . Years of Education: N/A   Occupational History  . 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 Smoking status: Never Smoker   . Smokeless tobacco: Never Used  . Alcohol Use: No  . Drug Use: No  . Sexual Activity: Yes   Other Topics Concern  . None   Social History Narrative   Family History  Problem Relation Age of Onset  . Hypertension Mother   . Heart disease Maternal Grandfather   . Stroke Maternal Grandfather   . Diabetes Paternal Grandmother     No Known Allergies Prior to Admission medications   Medication Sig Start Date End Date Taking? Authorizing Provider  ibuprofen (ADVIL,MOTRIN) 200 MG tablet Take 200 mg by mouth.   Yes Historical Provider, MD  pseudoephedrine (SUDAFED) 30 MG tablet Take 30 mg by mouth every 4 (four) hours as needed for congestion.   Yes Historical Provider, MD  Vitamin D, Ergocalciferol, (DRISDOL) 50000 units CAPS capsule Take 1 capsule (50,000 Units total) by mouth once a week. 12/11/15  Yes [name], PA-C   ROS: The patient denies chills, unintentional weight loss, chest pain, palpitations, wheezing, dyspnea on exertion, nausea, vomiting, abdominal pain, dysuria, hematuria, melena, numbness, weakness, or tingling.   All other systems have been reviewed and were otherwise negative with the exception of those mentioned in the HPI and as above.    PHYSICAL EXAM: Filed Vitals:   12/16/15 0838  BP: 124/88  Pulse: 128  Temp: 99.8 F (37.7 C)   Body mass index is 33.28 kg/(m^2).  General: Alert, no acute distress HEENT:  Normocephalic, atraumatic.TMs normal. 3+ swelling of tonsil with exudate.  Eye: Juliette Mangle [facility] Cardiovascular:  Regular rate and rhythm, no rubs murmurs or gallops.  No Carotid bruits, radial pulse intact. No pedal edema.  Respiratory: Clear to auscultation bilaterally.  No wheezes, rales,  or rhonchi.  No cyanosis, no use of accessory musculature Abdominal: No organomegaly, abdomen is soft and non-tender, positive bowel sounds.  No masses. Musculoskeletal: Gait intact. No edema, tenderness Skin: No rashes. Neurologic: Facial musculature symmetric. Psychiatric: Patient acts appropriately throughout our interaction. Lymphatic: Bilateral anterior cervical nodes, much more severe on the R with 1 node golf ball size.   LABS:   Results for orders placed or performed in visit on 12/16/15  POCT CBC  Result Value Ref Range   WBC 15.5 (A) 4.6 - 10.2 K/uL   Lymph, poc 1.4 0.6 - 3.4   POC  LYMPH PERCENT 9.3 (A) 10 - 50 %L   MID (cbc) 0.9 0 - 0.9   POC MID % 5.7 0 - 12 %M   POC Granulocyte 13.2 (A) 2 - 6.9   Granulocyte percent 85.0 (A) 37 - 80 %G   RBC 4.18 4.04 - 5.48 M/uL   Hemoglobin 12.3 12.2 - 16.2 g/dL   HCT, POC 35.7 (A) 37.7 - 47.9 %   MCV 85.5 80 - 97 fL   MCH, POC 29.4 27 - 31.2 pg   MCHC 34.4 31.8 - 35.4 g/dL   RDW, POC 12.8 %   Platelet Count, POC 217 142 - 424 K/uL   MPV 8.6 0 - 99.8 fL  POCT rapid strep A  Result Value Ref Range   Rapid Strep A Screen Positive (A) Negative   EKG/XRAY:   Primary read interpreted by Dr. Everlene Far[facility] at [facility].  ASSESSMENT/PLAN: Patient has significant lymphadenopathy with positive strep test and white count greater than 15,000. Will treat with 1 g of Rocephin along with amoxicillin twice a day. Recheck 48 hours if not improved.I personally performed the services described in this documentation, which was scribed in my presence. The recorded information has been reviewed and is accurate.    Gross sideeffects, risk and benefits, and alternatives of medications d/w patient. Patient is aware that all medications have potential sideeffects and we are unable to predict every sideeffect or drug-drug interaction that may occur.  Arlyss Queen MD 12/16/2015 8:45 AM

## 2015-12-29 DIAGNOSIS — Z6833 Body mass index (BMI) 33.0-33.9, adult: Secondary | ICD-10-CM | POA: Diagnosis not present

## 2015-12-29 DIAGNOSIS — Z01419 Encounter for gynecological examination (general) (routine) without abnormal findings: Secondary | ICD-10-CM | POA: Diagnosis not present

## 2016-01-14 ENCOUNTER — Encounter: Payer: BLUE CROSS/BLUE SHIELD | Admitting: Family Medicine

## 2016-08-23 ENCOUNTER — Ambulatory Visit (INDEPENDENT_AMBULATORY_CARE_PROVIDER_SITE_OTHER): Payer: BLUE CROSS/BLUE SHIELD | Admitting: Physician Assistant

## 2016-08-23 ENCOUNTER — Encounter: Payer: Self-pay | Admitting: Physician Assistant

## 2016-08-23 VITALS — BP 129/85 | HR 94 | Temp 98.9°F | Ht 65.0 in | Wt 199.6 lb

## 2016-08-23 DIAGNOSIS — E559 Vitamin D deficiency, unspecified: Secondary | ICD-10-CM | POA: Diagnosis not present

## 2016-08-23 DIAGNOSIS — Z130 Encounter for screening for diseases of the blood and blood-forming organs and certain disorders involving the immune mechanism: Secondary | ICD-10-CM

## 2016-08-23 DIAGNOSIS — Z0000 Encounter for general adult medical examination without abnormal findings: Secondary | ICD-10-CM | POA: Diagnosis not present

## 2016-08-23 DIAGNOSIS — Z1329 Encounter for screening for other suspected endocrine disorder: Secondary | ICD-10-CM | POA: Diagnosis not present

## 2016-08-23 DIAGNOSIS — Z13220 Encounter for screening for lipoid disorders: Secondary | ICD-10-CM | POA: Diagnosis not present

## 2016-08-23 DIAGNOSIS — Z13228 Encounter for screening for other metabolic disorders: Secondary | ICD-10-CM

## 2016-08-23 NOTE — Patient Instructions (Addendum)
Keeping You Healthy  Get These Tests 1. Blood Pressure- Have your blood pressure checked once a year by your health care provider.  Normal blood pressure is 120/80. 2. Weight- Have your body mass index (BMI) calculated to screen for obesity.  BMI is measure of body fat based on height and weight.  You can also calculate your own BMI at www.nhlbisupport.com/bmi/. 3. Cholesterol- Have your cholesterol checked every 5 years starting at age 46 then yearly starting at age 45. 4. Chlamydia, HIV, and other sexually transmitted diseases- Get screened every year until age 25, then within three months of each new sexual provider. 5. Pap Test - Every 1-5 years; discuss with your health care provider. 6. Mammogram- Every 1-2 years starting at age 40--50  Take these medicines  Calcium with Vitamin D-Your body needs 1200 mg of Calcium each day and 800-1000 IU of Vitamin D daily.  Your body can only absorb 500 mg of Calcium at a time so Calcium must be taken in 2 or 3 divided doses throughout the day.  Multivitamin with folic acid- Once daily if it is possible for you to become pregnant.  Get these Immunizations  Gardasil-Series of three doses; prevents HPV related illness such as genital warts and cervical cancer.  Menactra-Single dose; prevents meningitis.  Tetanus shot- Every 10 years.  Flu shot-Every year.  Take these steps 1. Do not smoke-Your healthcare provider can help you quit.  For tips on how to quit go to www.smokefree.gov or call 1-800 QUITNOW. 2. Be physically active- Exercise 5 days a week for at least 30 minutes.  If you are not already physically active, start slow and gradually work up to 30 minutes of moderate physical activity.  Examples of moderate activity include walking briskly, dancing, swimming, bicycling, etc. 3. Breast Cancer- A self breast exam every month is important for early detection of breast cancer.  For more information and instruction on self breast exams, ask your  healthcare provider or www.womenshealth.gov/faq/breast-self-exam.cfm. 4. Eat a healthy diet- Eat a variety of healthy foods such as fruits, vegetables, whole grains, low fat milk, low fat cheeses, yogurt, lean meats, poultry and fish, beans, nuts, tofu, etc.  For more information go to www. Thenutritionsource.org 5. Drink alcohol in moderation- Limit alcohol intake to one drink or less per day. Never drink and drive. 6. Depression- Your emotional health is as important as your physical health.  If you're feeling down or losing interest in things you normally enjoy please talk to your healthcare provider about being screened for depression. 7. Dental visit- Brush and floss your teeth twice daily; visit your dentist twice a year. 8. Eye doctor- Get an eye exam at least every 2 years. 9. Helmet use- Always wear a helmet when riding a bicycle, motorcycle, rollerblading or skateboarding. 10. Safe sex- If you may be exposed to sexually transmitted infections, use a condom. 11. Seat belts- Seat belts can save your live; always wear one. 12. Smoke/Carbon Monoxide detectors- These detectors need to be installed on the appropriate level of your home. Replace batteries at least once a year. 13. Skin cancer- When out in the sun please cover up and use sunscreen 15 SPF or higher. 14. Violence- If anyone is threatening or hurting you, please tell your healthcare provider.           IF you received an x-ray today, you will receive an invoice from [facility]. Please contact  Radiology at [phone] with questions or concerns regarding your invoice.     IF you received labwork today, you will receive an invoice from [facility]. Please contact [facility] at [phone] with questions or concerns regarding your invoice.   Our billing staff will not be able to assist you with questions regarding bills from these companies.  You will be contacted with the lab results as soon as they are  available. The fastest way to get your results is to activate your My Chart account. Instructions are located on the last page of this paperwork. If you have not heard from [place] regarding the results in 2 weeks, please contact this office.

## 2016-08-23 NOTE — Progress Notes (Signed)
[facility] [address], [place] [address] [phone]  Date:  08/23/2016   Name:  Chelsea Mason   DOB:  1970/11/12   MRN:  [id number]  PCP:  Ellsworth Lennox, MD    History of Present Illness:  Chelsea Mason is a 46 y.o. female patient who presents to [facility] for annual physical exam.    Diet: she has been slowing down the fast food intake.  She is eating more salads and less dressings.  She is focusing more on proteins and vegetables.  She is lessening her sausages.  She eats a pancake on a stick, juice.  Coffee occasionally.  Water intake: 2 cups per day.  Rare sodas.  She will have a lot of sweet tea half.    BM: ok.  She has no constipation or diarrhea.  No blood or black stool.  URINATION:  No dysuria, hematuria, or frequency.  SLEEP: 6-7 HOURS.   SOCIAL ACTIVITY: Exercise: once per week.  Climbing steps more than elevator since the beginning of the year.   2 kids, 80, 54.  Goddaughter.  2 pregnancies-cesarian.   Not sexually active currently/.   Pap was normal with hx of normal paps.   Shuttle kids.  She has social group via phone calls.  She does have support.   2017 mammogram yearly, normal.   Patient Active Problem List   Diagnosis Date Noted  . Bilateral bunions 10/23/2013  . Allergic rhinitis, cause unspecified 10/23/2013  . Unspecified vitamin D deficiency 10/02/2012  . Health care maintenance 10/02/2012    Past Medical History:  Diagnosis Date  . Allergy     Past Surgical History:  Procedure Laterality Date  . BUNIONECTOMY    . BUNIONECTOMY Left 12/21/2013   Dr. Craig Staggers [facility]  . BUNIONECTOMY Right 06/17/2014   Dr. Ila Mcgill  . CESAREAN SECTION     1) 11/2003  2)02/2005    Social History  Substance Use Topics  . Smoking status: Never Smoker  . Smokeless tobacco: Never Used  . Alcohol use No    Family History  Problem Relation Age of Onset  . Hypertension Mother   . Heart disease Maternal Grandfather   . Stroke  Maternal Grandfather   . Diabetes Paternal Grandmother     No Known Allergies  Medication list has been reviewed and updated.  Current Outpatient Prescriptions on File Prior to Visit  Medication Sig Dispense Refill  . ibuprofen (ADVIL,MOTRIN) 200 MG tablet Take 200 mg by mouth.    . pseudoephedrine (SUDAFED) 30 MG tablet Take 30 mg by mouth every 4 (four) hours as needed for congestion.    . Vitamin D, Ergocalciferol, (DRISDOL) 50000 units CAPS capsule Take 1 capsule (50,000 Units total) by mouth once a week. 12 capsule 3  . amoxicillin (AMOXIL) 875 MG tablet Take 1 tablet (875 mg total) by mouth 2 (two) times daily. (Patient not taking: Reported on 08/23/2016) 20 tablet 0   No current facility-administered medications on file prior to visit.     Review of Systems  Constitutional: Negative for chills and fever.  HENT: Negative for ear discharge, ear pain and sore throat.   Eyes: Negative for blurred vision and double vision.  Respiratory: Negative for cough, shortness of breath and wheezing.   Cardiovascular: Negative for chest pain, palpitations and leg swelling.  Gastrointestinal: Negative for diarrhea, nausea and vomiting.  Genitourinary: Negative for dysuria, frequency and hematuria.  Skin: Negative for itching and rash.  Neurological:  Negative for dizziness and headaches.     Physical Examination: BP 129/85 (BP Location: Right Arm, Patient Position: Sitting, Cuff Size: Large)   Pulse 94   Temp 98.9 F (37.2 C) (Oral)   Ht _0  (1.651 m)   Wt 199 lb 9.6 oz (90.5 kg)   LMP 08/08/2016   SpO2 100%   BMI 33.22 kg/m  Ideal Body Weight: Weight in (lb) to have BMI = 25: 149.9  Physical Exam  Constitutional: She is oriented to person, place, and time. She appears well-developed and well-nourished. No distress.  HENT:  Head: Normocephalic and atraumatic.  Right Ear: Tympanic membrane, external ear and ear canal normal.  Left Ear: Tympanic membrane, external ear and ear  canal normal.  Nose: Right sinus exhibits no maxillary sinus tenderness and no frontal sinus tenderness. Left sinus exhibits no maxillary sinus tenderness and no frontal sinus tenderness.  Mouth/Throat: Oropharynx is clear and moist. No uvula swelling. No oropharyngeal exudate, posterior oropharyngeal edema or posterior oropharyngeal erythema.  Eyes: Conjunctivae and EOM are normal. Pupils are equal, round, and reactive to light.  Neck: Normal range of motion. Neck supple. No thyromegaly present.  Cardiovascular: Normal rate, regular rhythm, normal heart sounds and intact distal pulses.  Exam reveals no gallop, no distant heart sounds and no friction rub.   No murmur heard. Pulmonary/Chest: Effort normal and breath sounds normal. No respiratory distress. She has no decreased breath sounds. She has no wheezes. She has no rhonchi.  Abdominal: Soft. Bowel sounds are normal. She exhibits no distension and no mass. There is no tenderness.  Musculoskeletal: Normal range of motion. She exhibits no edema or tenderness.  Lymphadenopathy:       Head (right side): No submandibular, no tonsillar, no preauricular and no posterior auricular adenopathy present.       Head (left side): No submandibular, no tonsillar, no preauricular and no posterior auricular adenopathy present.    She has no cervical adenopathy.  Neurological: She is alert and oriented to person, place, and time. No cranial nerve deficit. She exhibits normal muscle tone. Coordination normal.  Skin: Skin is warm and dry. She is not diaphoretic.  Psychiatric: She has a normal mood and affect. Her behavior is normal.     Assessment and Plan: [name] is a 46 y.o. female who is here today for annual physical exam. She will return for fasting labs as discussed.  Annual physical exam - Plan: CBC, CMP14+EGFR, Lipid panel, TSH, VITAMIN D 25 Hydroxy (Vit-D Deficiency, Fractures), CBC, CMP14+EGFR, Lipid panel, TSH, CANCELED: CBC with  Differential/Platelet, CANCELED: CMP14+EGFR, CANCELED: TSH, CANCELED: Lipid panel, CANCELED: VITAMIN D 25 Hydroxy (Vit-D Deficiency, Fractures), CANCELED: CBC, CANCELED: TSH, CANCELED: Lipid panel, CANCELED: VITAMIN D 25 Hydroxy (Vit-D Deficiency, Fractures), CANCELED: CMP14+EGFR  Screening for metabolic disorder - Plan: CMP14+EGFR, CMP14+EGFR, CANCELED: CMP14+EGFR, CANCELED: CMP14+EGFR  Screening for thyroid disorder - Plan: TSH, TSH, CANCELED: TSH  Screening for lipoid disorders - Plan: Lipid panel, Lipid panel, CANCELED: Lipid panel, CANCELED: Lipid panel  Vitamin D deficiency - Plan: VITAMIN D 25 Hydroxy (Vit-D Deficiency, Fractures), CANCELED: VITAMIN D 25 Hydroxy (Vit-D Deficiency, Fractures), CANCELED: VITAMIN D 25 Hydroxy (Vit-D Deficiency, Fractures)  Screening for deficiency anemia - Plan: CBC, CBC, CANCELED: CBC with Differential/Platelet, CANCELED: CBC  Ivar Drape, PA-C [facility] 2/28/201811:25 PM

## 2016-08-24 DIAGNOSIS — Z130 Encounter for screening for diseases of the blood and blood-forming organs and certain disorders involving the immune mechanism: Secondary | ICD-10-CM | POA: Diagnosis not present

## 2016-08-24 DIAGNOSIS — Z0000 Encounter for general adult medical examination without abnormal findings: Secondary | ICD-10-CM | POA: Diagnosis not present

## 2016-08-24 DIAGNOSIS — Z13228 Encounter for screening for other metabolic disorders: Secondary | ICD-10-CM | POA: Diagnosis not present

## 2016-08-24 DIAGNOSIS — Z13220 Encounter for screening for lipoid disorders: Secondary | ICD-10-CM | POA: Diagnosis not present

## 2016-08-25 LAB — CMP14+EGFR
A/G RATIO: 1.3 (ref 1.2–2.2)
ALBUMIN: 4.3 g/dL (ref 3.5–5.5)
ALK PHOS: 59 IU/L (ref 39–117)
ALT: 9 IU/L (ref 0–32)
AST: 12 IU/L (ref 0–40)
BILIRUBIN TOTAL: 0.4 mg/dL (ref 0.0–1.2)
BUN / CREAT RATIO: 13 (ref 9–23)
BUN: 11 mg/dL (ref 6–24)
CHLORIDE: 99 mmol/L (ref 96–106)
CO2: 24 mmol/L (ref 18–29)
Calcium: 9 mg/dL (ref 8.7–10.2)
Creatinine, Ser: 0.87 mg/dL (ref 0.57–1.00)
GFR calc non Af Amer: 80 (ref >59)
GFR, EST AFRICAN AMERICAN: 92 (ref >59)
GLUCOSE: 96 mg/dL (ref 65–99)
Globulin, Total: 3.3 (ref 1.5–4.5)
Potassium: 3.9 mmol/L (ref 3.5–5.2)
SODIUM: 140 mmol/L (ref 134–144)
TOTAL PROTEIN: 7.6 g/dL (ref 6.0–8.5)

## 2016-08-25 LAB — CBC
HEMATOCRIT: 38.6 % (ref 34.0–46.6)
HEMOGLOBIN: 12 g/dL (ref 11.1–15.9)
MCH: 28.6 pg (ref 26.6–33.0)
MCHC: 31.1 g/dL — AB (ref 31.5–35.7)
MCV: 92 fL (ref 79–97)
Platelets: 255 10*3/uL (ref 150–379)
RBC: 4.2 x10E6/uL (ref 3.77–5.28)
RDW: 12.8 % (ref 12.3–15.4)
WBC: 4.9 10*3/uL (ref 3.4–10.8)

## 2016-08-25 LAB — LIPID PANEL
CHOLESTEROL TOTAL: 128 mg/dL (ref 100–199)
Chol/HDL Ratio: 2.5 (ref 0.0–4.4)
HDL: 51 mg/dL (ref >39)
LDL Calculated: 68 (ref 0–99)
Triglycerides: 47 mg/dL (ref 0–149)
VLDL Cholesterol Cal: 9 (ref 5–40)

## 2016-08-25 LAB — TSH: TSH: 0.755 u[IU]/mL (ref 0.450–4.500)

## 2016-11-14 ENCOUNTER — Other Ambulatory Visit: Payer: Self-pay | Admitting: Obstetrics and Gynecology

## 2016-11-14 DIAGNOSIS — Z1231 Encounter for screening mammogram for malignant neoplasm of breast: Secondary | ICD-10-CM

## 2016-11-29 ENCOUNTER — Ambulatory Visit
Admission: RE | Admit: 2016-11-29 | Discharge: 2016-11-29 | Disposition: A | Discharge status: Home / Self Care | Payer: BLUE CROSS/BLUE SHIELD | Source: Ambulatory Visit | Attending: Obstetrics and Gynecology | Admitting: Obstetrics and Gynecology

## 2016-11-29 DIAGNOSIS — Z1231 Encounter for screening mammogram for malignant neoplasm of breast: Secondary | ICD-10-CM

## 2017-01-31 DIAGNOSIS — Z01419 Encounter for gynecological examination (general) (routine) without abnormal findings: Secondary | ICD-10-CM | POA: Diagnosis not present

## 2017-01-31 DIAGNOSIS — Z6832 Body mass index (BMI) 32.0-32.9, adult: Secondary | ICD-10-CM | POA: Diagnosis not present

## 2017-01-31 DIAGNOSIS — Z124 Encounter for screening for malignant neoplasm of cervix: Secondary | ICD-10-CM | POA: Diagnosis not present

## 2017-04-13 DIAGNOSIS — F4321 Adjustment disorder with depressed mood: Secondary | ICD-10-CM | POA: Diagnosis not present

## 2017-04-19 DIAGNOSIS — F4321 Adjustment disorder with depressed mood: Secondary | ICD-10-CM | POA: Diagnosis not present

## 2017-05-03 DIAGNOSIS — F4321 Adjustment disorder with depressed mood: Secondary | ICD-10-CM | POA: Diagnosis not present

## 2017-05-09 DIAGNOSIS — F4321 Adjustment disorder with depressed mood: Secondary | ICD-10-CM | POA: Diagnosis not present

## 2017-05-16 DIAGNOSIS — F4321 Adjustment disorder with depressed mood: Secondary | ICD-10-CM | POA: Diagnosis not present

## 2017-05-31 DIAGNOSIS — F4321 Adjustment disorder with depressed mood: Secondary | ICD-10-CM | POA: Diagnosis not present

## 2017-06-19 DIAGNOSIS — F4321 Adjustment disorder with depressed mood: Secondary | ICD-10-CM | POA: Diagnosis not present

## 2017-07-05 DIAGNOSIS — F4321 Adjustment disorder with depressed mood: Secondary | ICD-10-CM | POA: Diagnosis not present

## 2017-11-03 ENCOUNTER — Other Ambulatory Visit: Payer: Self-pay | Admitting: Obstetrics and Gynecology

## 2017-11-03 DIAGNOSIS — Z1231 Encounter for screening mammogram for malignant neoplasm of breast: Secondary | ICD-10-CM

## 2017-11-23 ENCOUNTER — Encounter: Payer: BLUE CROSS/BLUE SHIELD | Admitting: Urgent Care

## 2017-11-24 ENCOUNTER — Ambulatory Visit (INDEPENDENT_AMBULATORY_CARE_PROVIDER_SITE_OTHER): Payer: BLUE CROSS/BLUE SHIELD | Admitting: Urgent Care

## 2017-11-24 ENCOUNTER — Encounter: Payer: Self-pay | Admitting: Urgent Care

## 2017-11-24 ENCOUNTER — Other Ambulatory Visit: Payer: Self-pay

## 2017-11-24 VITALS — BP 136/74 | HR 98 | Temp 98.3°F | Resp 18 | Ht 65.0 in | Wt 195.4 lb

## 2017-11-24 DIAGNOSIS — Z13228 Encounter for screening for other metabolic disorders: Secondary | ICD-10-CM

## 2017-11-24 DIAGNOSIS — Z114 Encounter for screening for human immunodeficiency virus [HIV]: Secondary | ICD-10-CM

## 2017-11-24 DIAGNOSIS — E559 Vitamin D deficiency, unspecified: Secondary | ICD-10-CM | POA: Diagnosis not present

## 2017-11-24 DIAGNOSIS — Z1329 Encounter for screening for other suspected endocrine disorder: Secondary | ICD-10-CM

## 2017-11-24 DIAGNOSIS — Z0000 Encounter for general adult medical examination without abnormal findings: Secondary | ICD-10-CM

## 2017-11-24 DIAGNOSIS — Z130 Encounter for screening for diseases of the blood and blood-forming organs and certain disorders involving the immune mechanism: Secondary | ICD-10-CM | POA: Diagnosis not present

## 2017-11-24 DIAGNOSIS — Z13220 Encounter for screening for lipoid disorders: Secondary | ICD-10-CM

## 2017-11-24 NOTE — Patient Instructions (Addendum)
Health Maintenance, Female Adopting a healthy lifestyle and getting preventive care can go a long way to promote health and wellness. Talk with your health care provider about what schedule of regular examinations is right for you. This is a good chance for you to check in with your provider about disease prevention and staying healthy. In between checkups, there are plenty of things you can do on your own. Experts have done a lot of research about which lifestyle changes and preventive measures are most likely to keep you healthy. Ask your health care provider for more information. Weight and diet Eat a healthy diet  Be sure to include plenty of vegetables, fruits, low-fat dairy products, and lean protein.  Do not eat a lot of foods high in solid fats, added sugars, or salt.  Get regular exercise. This is one of the most important things you can do for your health. ? Most adults should exercise for at least 150 minutes each week. The exercise should increase your heart rate and make you sweat (moderate-intensity exercise). ? Most adults should also do strengthening exercises at least twice a week. This is in addition to the moderate-intensity exercise.  Maintain a healthy weight  Body mass index (BMI) is a measurement that can be used to identify possible weight problems. It estimates body fat based on height and weight. Your health care provider can help determine your BMI and help you achieve or maintain a healthy weight.  For females 20 years of age and older: ? A BMI below 18.5 is considered underweight. ? A BMI of 18.5 to 24.9 is normal. ? A BMI of 25 to 29.9 is considered overweight. ? A BMI of 30 and above is considered obese.  Watch levels of cholesterol and blood lipids  You should start having your blood tested for lipids and cholesterol at 47 years of age, then have this test every 5 years.  You may need to have your cholesterol levels checked more often if: ? Your lipid or  cholesterol levels are high. ? You are older than 47 years of age. ? You are at high risk for heart disease.  Cancer screening Lung Cancer  Lung cancer screening is recommended for adults 55-80 years old who are at high risk for lung cancer because of a history of smoking.  A yearly low-dose CT scan of the lungs is recommended for people who: ? Currently smoke. ? Have quit within the past 15 years. ? Have at least a 30-pack-year history of smoking. A pack year is smoking an average of one pack of cigarettes a day for 1 year.  Yearly screening should continue until it has been 15 years since you quit.  Yearly screening should stop if you develop a health problem that would prevent you from having lung cancer treatment.  Breast Cancer  Practice breast self-awareness. This means understanding how your breasts normally appear and feel.  It also means doing regular breast self-exams. Let your health care provider know about any changes, no matter how small.  If you are in your 20s or 30s, you should have a clinical breast exam (CBE) by a health care provider every 1-3 years as part of a regular health exam.  If you are 40 or older, have a CBE every year. Also consider having a breast X-ray (mammogram) every year.  If you have a family history of breast cancer, talk to your health care provider about genetic screening.  If you are at high risk   for breast cancer, talk to your health care provider about having an MRI and a mammogram every year.  Breast cancer gene (BRCA) assessment is recommended for women who have family members with BRCA-related cancers. BRCA-related cancers include: ? Breast. ? Ovarian. ? Tubal. ? Peritoneal cancers.  Results of the assessment will determine the need for genetic counseling and BRCA1 and BRCA2 testing.  Cervical Cancer Your health care provider may recommend that you be screened regularly for cancer of the pelvic organs (ovaries, uterus, and  vagina). This screening involves a pelvic examination, including checking for microscopic changes to the surface of your cervix (Pap test). You may be encouraged to have this screening done every 3 years, beginning at age 22.  For women ages 56-65, health care providers may recommend pelvic exams and Pap testing every 3 years, or they may recommend the Pap and pelvic exam, combined with testing for human papilloma virus (HPV), every 5 years. Some types of HPV increase your risk of cervical cancer. Testing for HPV may also be done on women of any age with unclear Pap test results.  Other health care providers may not recommend any screening for nonpregnant women who are considered low risk for pelvic cancer and who do not have symptoms. Ask your health care provider if a screening pelvic exam is right for you.  If you have had past treatment for cervical cancer or a condition that could lead to cancer, you need Pap tests and screening for cancer for at least 20 years after your treatment. If Pap tests have been discontinued, your risk factors (such as having a new sexual partner) need to be reassessed to determine if screening should resume. Some women have medical problems that increase the chance of getting cervical cancer. In these cases, your health care provider may recommend more frequent screening and Pap tests.  Colorectal Cancer  This type of cancer can be detected and often prevented.  Routine colorectal cancer screening usually begins at 47 years of age and continues through 47 years of age.  Your health care provider may recommend screening at an earlier age if you have risk factors for colon cancer.  Your health care provider may also recommend using home test kits to check for hidden blood in the stool.  A small camera at the end of a tube can be used to examine your colon directly (sigmoidoscopy or colonoscopy). This is done to check for the earliest forms of colorectal  cancer.  Routine screening usually begins at age 33.  Direct examination of the colon should be repeated every 5-10 years through 47 years of age. However, you may need to be screened more often if early forms of precancerous polyps or small growths are found.  Skin Cancer  Check your skin from head to toe regularly.  Tell your health care provider about any new moles or changes in moles, especially if there is a change in a mole's shape or color.  Also tell your health care provider if you have a mole that is larger than the size of a pencil eraser.  Always use sunscreen. Apply sunscreen liberally and repeatedly throughout the day.  Protect yourself by wearing long sleeves, pants, a wide-brimmed hat, and sunglasses whenever you are outside.  Heart disease, diabetes, and high blood pressure  High blood pressure causes heart disease and increases the risk of stroke. High blood pressure is more likely to develop in: ? People who have blood pressure in the high end of  the normal range (130-139/85-89 mm Hg). ? People who are overweight or obese. ? People who are African American.  If you are 21-29 years of age, have your blood pressure checked every 3-5 years. If you are 3 years of age or older, have your blood pressure checked every year. You should have your blood pressure measured twice-once when you are at a hospital or clinic, and once when you are not at a hospital or clinic. Record the average of the two measurements. To check your blood pressure when you are not at a hospital or clinic, you can use: ? An automated blood pressure machine at a pharmacy. ? A home blood pressure monitor.  If you are between 17 years and 37 years old, ask your health care provider if you should take aspirin to prevent strokes.  Have regular diabetes screenings. This involves taking a blood sample to check your fasting blood sugar level. ? If you are at a normal weight and have a low risk for diabetes,  have this test once every three years after 47 years of age. ? If you are overweight and have a high risk for diabetes, consider being tested at a younger age or more often. Preventing infection Hepatitis B  If you have a higher risk for hepatitis B, you should be screened for this virus. You are considered at high risk for hepatitis B if: ? You were born in a country where hepatitis B is common. Ask your health care provider which countries are considered high risk. ? Your parents were born in a high-risk country, and you have not been immunized against hepatitis B (hepatitis B vaccine). ? You have HIV or AIDS. ? You use needles to inject street drugs. ? You live with someone who has hepatitis B. ? You have had sex with someone who has hepatitis B. ? You get hemodialysis treatment. ? You take certain medicines for conditions, including cancer, organ transplantation, and autoimmune conditions.  Hepatitis C  Blood testing is recommended for: ? Everyone born from [birth year] through 1965. ? Anyone with known risk factors for hepatitis C.  Sexually transmitted infections (STIs)  You should be screened for sexually transmitted infections (STIs) including gonorrhea and chlamydia if: ? You are sexually active and are younger than 47 years of age. ? You are older than 47 years of age and your health care provider tells you that you are at risk for this type of infection. ? Your sexual activity has changed since you were last screened and you are at an increased risk for chlamydia or gonorrhea. Ask your health care provider if you are at risk.  If you do not have HIV, but are at risk, it may be recommended that you take a prescription medicine daily to prevent HIV infection. This is called pre-exposure prophylaxis (PrEP). You are considered at risk if: ? You are sexually active and do not regularly use condoms or know the HIV status of your partner(s). ? You take drugs by injection. ? You are  sexually active with a partner who has HIV.  Talk with your health care provider about whether you are at high risk of being infected with HIV. If you choose to begin PrEP, you should first be tested for HIV. You should then be tested every 3 months for as long as you are taking PrEP. Pregnancy  If you are premenopausal and you may become pregnant, ask your health care provider about preconception counseling.  If you may become  pregnant, take 400 to 800 micrograms (mcg) of folic acid every day.  If you want to prevent pregnancy, talk to your health care provider about birth control (contraception). Osteoporosis and menopause  Osteoporosis is a disease in which the bones lose minerals and strength with aging. This can result in serious bone fractures. Your risk for osteoporosis can be identified using a bone density scan.  If you are 65 years of age or older, or if you are at risk for osteoporosis and fractures, ask your health care provider if you should be screened.  Ask your health care provider whether you should take a calcium or vitamin D supplement to lower your risk for osteoporosis.  Menopause may have certain physical symptoms and risks.  Hormone replacement therapy may reduce some of these symptoms and risks. Talk to your health care provider about whether hormone replacement therapy is right for you. Follow these instructions at home:  Schedule regular health, dental, and eye exams.  Stay current with your immunizations.  Do not use any tobacco products including cigarettes, chewing tobacco, or electronic cigarettes.  If you are pregnant, do not drink alcohol.  If you are breastfeeding, limit how much and how often you drink alcohol.  Limit alcohol intake to no more than 1 drink per day for nonpregnant women. One drink equals 12 ounces of beer, 5 ounces of wine, or 1 ounces of hard liquor.  Do not use street drugs.  Do not share needles.  Ask your health care  provider for help if you need support or information about quitting drugs.  Tell your health care provider if you often feel depressed.  Tell your health care provider if you have ever been abused or do not feel safe at home. This information is not intended to replace advice given to you by your health care provider. Make sure you discuss any questions you have with your health care provider. Document Released: 01/03/2011 Document Revised: 11/26/2015 Document Reviewed: 03/24/2015 Elsevier Interactive Patient Education  2018 [employer]     IF you received an x-ray today, you will receive an invoice from [facility]. Please contact [facility] at [phone] with questions or concerns regarding your invoice.   IF you received labwork today, you will receive an invoice from [facility]. Please contact [facility] at [phone] with questions or concerns regarding your invoice.   Our billing staff will not be able to assist you with questions regarding bills from these companies.  You will be contacted with the lab results as soon as they are available. The fastest way to get your results is to activate your My Chart account. Instructions are located on the last page of this paperwork. If you have not heard from us regarding the results in 2 weeks, please contact this office.      

## 2017-11-24 NOTE — Progress Notes (Signed)
MRN: [id number]  Subjective:   [name] is a 47 y.o. female presenting for annual physical exam.  She works as a Psychiatrist.  She is divorced, not sexually active.  Patient practices a healthy diet but does not exercise. Has good relationships at home, has a good support network. Denies smoking cigarettes or drinking alcohol.   Medical care team includes: PCP: Barton Fanny, MD Vision: Patient sees Dr. Idolina Primer, has appointment scheduled in July 2019. Dental: She gets dental care every 6 months, has a cleaning scheduled June 2019. OB/GYN: Patient had her Pap smear pleaded July 20 17th and was normal, mammography scheduled next week on 11/30/2017. Specialists: None.  Health Maintenance: Age-appropriate screening completed today.  Immunization History  Administered Date(s) Administered  . Influenza-Unspecified 05/18/2014, 05/31/2016  . Tdap 10/23/2013    Hadlyn has a current medication list which includes the following prescription(s): cetirizine, ibuprofen, pseudoephedrine, and vitamin d (ergocalciferol). She has No Known Allergies. Nancie  has a past medical history of Allergy. Also  has a past surgical history that includes Cesarean section; Bunionectomy; Bunionectomy (Left, 12/21/2013); and Bunionectomy (Right, 06/17/2014). Her family history includes Diabetes in her paternal grandmother; Heart disease in her maternal grandfather; Hypertension in her mother; Stroke in her maternal grandfather.  Review of Systems  Constitutional: Negative for chills, diaphoresis, fever, malaise/fatigue and weight loss.  HENT: Negative for congestion, ear discharge, ear pain, hearing loss, nosebleeds, sore throat and tinnitus.   Eyes: Negative for blurred vision, double vision, photophobia, pain, discharge and redness.  Respiratory: Negative for cough, shortness of breath and wheezing.   Cardiovascular: Negative for chest pain, palpitations and leg swelling.  Gastrointestinal:  Negative for abdominal pain, blood in stool, constipation, diarrhea, nausea and vomiting.  Genitourinary: Negative for dysuria, flank pain, frequency, hematuria and urgency.  Musculoskeletal: Negative for back pain, joint pain and myalgias.  Skin: Negative for itching and rash.  Neurological: Negative for dizziness, tingling, seizures, loss of consciousness, weakness and headaches.  Endo/Heme/Allergies: Negative for polydipsia.  Psychiatric/Behavioral: Negative for depression, hallucinations, memory loss, substance abuse and suicidal ideas. The patient is not nervous/anxious and does not have insomnia.    Objective:   Vitals: BP 136/74 (BP Location: Right Arm, Patient Position: Sitting, Cuff Size: Large)   Pulse 98   Temp 98.3 F (36.8 C) (Oral)   Resp 18   Ht 5\' 5"  (1.651 m)   Wt 195 lb 6.4 oz (88.6 kg)   LMP 10/29/2017   SpO2 100%   BMI 32.52 kg/m   Physical Exam  Constitutional: She is oriented to person, place, and time. She appears well-developed and well-nourished.  HENT:  TM's intact bilaterally, no effusions or erythema. Nasal turbinates pink and moist, nasal passages patent. No sinus tenderness. Oropharynx clear, mucous membranes moist, dentition in good repair.  Eyes: Pupils are equal, round, and reactive to light. Conjunctivae and EOM are normal. Right eye exhibits no discharge. Left eye exhibits no discharge. No scleral icterus.  Neck: Normal range of motion. Neck supple. No thyromegaly present.  Cardiovascular: Normal rate, regular rhythm and intact distal pulses. Exam reveals no gallop and no friction rub.  No murmur heard. Pulmonary/Chest: No respiratory distress. She has no wheezes. She has no rales.  Abdominal: Soft. Bowel sounds are normal. She exhibits no distension and no mass. There is no tenderness.  Musculoskeletal: Normal range of motion. She exhibits no edema or tenderness.  Lymphadenopathy:    She has no cervical adenopathy.  Neurological: She  is alert  and oriented to person, place, and time. She has normal reflexes. She displays normal reflexes. Coordination normal.  Skin: Skin is warm and dry. No rash noted. No erythema. No pallor.  Psychiatric: She has a normal mood and affect.    Assessment and Plan :   Annual physical exam  Screening for metabolic disorder - Plan: Comprehensive metabolic panel  Screening for thyroid disorder - Plan: TSH  Screening for lipoid disorders - Plan: Lipid panel  Vitamin D deficiency - Plan: VITAMIN D 25 Hydroxy (Vit-D Deficiency, Fractures)  Screening for deficiency anemia - Plan: CBC  Screening for HIV without presence of risk factors - Plan: HIV antibody  Patient is very pleasant, medically stable.  Labs pending. Discussed healthy lifestyle, diet, exercise, preventative care, vaccinations, and addressed patient's concerns.    [name], PA-C [facility] [phone] 11/24/2017  3:00 PM

## 2017-11-25 LAB — COMPREHENSIVE METABOLIC PANEL WITH GFR
ALT: 11 [IU]/L (ref 0–32)
AST: 11 [IU]/L (ref 0–40)
Albumin/Globulin Ratio: 1.3 (ref 1.2–2.2)
Albumin: 4.2 g/dL (ref 3.5–5.5)
Alkaline Phosphatase: 54 [IU]/L (ref 39–117)
BUN/Creatinine Ratio: 8 — ABNORMAL LOW (ref 9–23)
BUN: 7 mg/dL (ref 6–24)
Bilirubin Total: 0.4 mg/dL (ref 0.0–1.2)
CO2: 23 mmol/L (ref 20–29)
Calcium: 9.1 mg/dL (ref 8.7–10.2)
Chloride: 104 mmol/L (ref 96–106)
Creatinine, Ser: 0.88 mg/dL (ref 0.57–1.00)
GFR calc Af Amer: 90 mL/min/{1.73_m2}
GFR calc non Af Amer: 78 mL/min/{1.73_m2}
Globulin, Total: 3.2 g/dL (ref 1.5–4.5)
Glucose: 103 mg/dL — ABNORMAL HIGH (ref 65–99)
Potassium: 4.2 mmol/L (ref 3.5–5.2)
Sodium: 141 mmol/L (ref 134–144)
Total Protein: 7.4 g/dL (ref 6.0–8.5)

## 2017-11-25 LAB — CBC
HEMATOCRIT: 39.5 % (ref 34.0–46.6)
HEMOGLOBIN: 12.2 g/dL (ref 11.1–15.9)
MCH: 28.8 pg (ref 26.6–33.0)
MCHC: 30.9 g/dL — AB (ref 31.5–35.7)
MCV: 93 fL (ref 79–97)
Platelets: 230 10*3/uL (ref 150–450)
RBC: 4.23 x10E6/uL (ref 3.77–5.28)
RDW: 12.6 % (ref 12.3–15.4)
WBC: 5.9 10*3/uL (ref 3.4–10.8)

## 2017-11-25 LAB — LIPID PANEL
CHOL/HDL RATIO: 2.6 ratio (ref 0.0–4.4)
Cholesterol, Total: 130 mg/dL (ref 100–199)
HDL: 50 mg/dL (ref >39)
LDL Calculated: 71 mg/dL (ref 0–99)
Triglycerides: 46 mg/dL (ref 0–149)
VLDL Cholesterol Cal: 9 mg/dL (ref 5–40)

## 2017-11-25 LAB — VITAMIN D 25 HYDROXY (VIT D DEFICIENCY, FRACTURES): Vit D, 25-Hydroxy: 14.1 ng/mL — ABNORMAL LOW (ref 30.0–100.0)

## 2017-11-25 LAB — TSH: TSH: 0.809 u[IU]/mL (ref 0.450–4.500)

## 2017-11-25 LAB — HIV ANTIBODY (ROUTINE TESTING W REFLEX): HIV Screen 4th Generation wRfx: NONREACTIVE

## 2017-11-30 ENCOUNTER — Ambulatory Visit
Admission: RE | Admit: 2017-11-30 | Discharge: 2017-11-30 | Disposition: A | Discharge status: Home / Self Care | Payer: BLUE CROSS/BLUE SHIELD | Source: Ambulatory Visit | Attending: Obstetrics and Gynecology | Admitting: Obstetrics and Gynecology

## 2017-11-30 DIAGNOSIS — Z1231 Encounter for screening mammogram for malignant neoplasm of breast: Secondary | ICD-10-CM

## 2017-12-01 ENCOUNTER — Other Ambulatory Visit: Payer: Self-pay | Admitting: Obstetrics and Gynecology

## 2017-12-01 DIAGNOSIS — R928 Other abnormal and inconclusive findings on diagnostic imaging of breast: Secondary | ICD-10-CM

## 2017-12-05 ENCOUNTER — Ambulatory Visit: Payer: BLUE CROSS/BLUE SHIELD

## 2017-12-05 ENCOUNTER — Ambulatory Visit
Admission: RE | Admit: 2017-12-05 | Discharge: 2017-12-05 | Disposition: A | Discharge status: Home / Self Care | Payer: BLUE CROSS/BLUE SHIELD | Source: Ambulatory Visit | Attending: Obstetrics and Gynecology | Admitting: Obstetrics and Gynecology

## 2017-12-05 ENCOUNTER — Other Ambulatory Visit: Payer: BLUE CROSS/BLUE SHIELD

## 2017-12-05 DIAGNOSIS — R922 Inconclusive mammogram: Secondary | ICD-10-CM | POA: Diagnosis not present

## 2017-12-05 DIAGNOSIS — R928 Other abnormal and inconclusive findings on diagnostic imaging of breast: Secondary | ICD-10-CM

## 2018-02-01 DIAGNOSIS — Z01419 Encounter for gynecological examination (general) (routine) without abnormal findings: Secondary | ICD-10-CM | POA: Diagnosis not present

## 2018-02-01 DIAGNOSIS — Z6833 Body mass index (BMI) 33.0-33.9, adult: Secondary | ICD-10-CM | POA: Diagnosis not present

## 2018-11-13 ENCOUNTER — Other Ambulatory Visit: Payer: Self-pay | Admitting: Obstetrics and Gynecology

## 2018-11-13 DIAGNOSIS — Z1231 Encounter for screening mammogram for malignant neoplasm of breast: Secondary | ICD-10-CM

## 2018-11-20 ENCOUNTER — Other Ambulatory Visit: Payer: Self-pay

## 2018-11-20 ENCOUNTER — Encounter: Payer: Self-pay | Admitting: Registered Nurse

## 2018-11-20 ENCOUNTER — Ambulatory Visit (INDEPENDENT_AMBULATORY_CARE_PROVIDER_SITE_OTHER): Payer: BLUE CROSS/BLUE SHIELD | Admitting: Registered Nurse

## 2018-11-20 DIAGNOSIS — Z13220 Encounter for screening for lipoid disorders: Secondary | ICD-10-CM

## 2018-11-20 DIAGNOSIS — Z7689 Persons encountering health services in other specified circumstances: Secondary | ICD-10-CM | POA: Diagnosis not present

## 2018-11-20 DIAGNOSIS — Z13228 Encounter for screening for other metabolic disorders: Secondary | ICD-10-CM | POA: Diagnosis not present

## 2018-11-20 DIAGNOSIS — Z130 Encounter for screening for diseases of the blood and blood-forming organs and certain disorders involving the immune mechanism: Secondary | ICD-10-CM

## 2018-11-20 DIAGNOSIS — Z131 Encounter for screening for diabetes mellitus: Secondary | ICD-10-CM

## 2018-11-20 DIAGNOSIS — Z1329 Encounter for screening for other suspected endocrine disorder: Secondary | ICD-10-CM

## 2018-11-20 DIAGNOSIS — E559 Vitamin D deficiency, unspecified: Secondary | ICD-10-CM | POA: Diagnosis not present

## 2018-11-20 NOTE — Patient Instructions (Addendum)
TOC patient that needs to Est a new PCP for her Annual Exam/lab.     If you have lab work done today you will be contacted with your lab results within the next 2 weeks.  If you have not heard from [place] then please contact us. The fastest way to get your results is to register for My Chart.   IF you received an x-ray today, you will receive an invoice from [facility]. Please contact [facility] at [phone] with questions or concerns regarding your invoice.   IF you received labwork today, you will receive an invoice from [facility]. Please contact [facility] at [phone] with questions or concerns regarding your invoice.   Our billing staff will not be able to assist you with questions regarding bills from these companies.  You will be contacted with the lab results as soon as they are available. The fastest way to get your results is to activate your My Chart account. Instructions are located on the last page of this paperwork. If you have not heard from [place] regarding the results in 2 weeks, please contact this office.

## 2018-11-20 NOTE — Progress Notes (Signed)
Telemedicine Encounter- SOAP NOTE Established Patient  This telephone encounter was conducted with the patient's (or proxy's) verbal consent via audio telecommunications: yes   Patient was instructed to have this encounter in a suitably private space; and to only have persons present to whom they give permission to participate. In addition, patient identity was confirmed by use of name plus two identifiers (DOB and address).  I discussed the limitations, risks, security and privacy concerns of performing an evaluation and management service by telephone and the availability of in person appointments. I also discussed with the patient that there may be a patient responsible charge related to this service. The patient expressed understanding and agreed to proceed.  I spent a total of 19 minutes talking with the patient or their proxy.  Chief Complaint  Patient presents with  . Transitions Of Care    need new PCP for Annual/labs    Subjective   Chelsea [place] is a 48 y.o. established patient. Telephone visit today to establish care with myself as PCP  Pt is in generally good health, reports no urgent or chronic concerns. Formerly patient of Bess Harvest, [place], [place], PA-C and Dr. Leward Quan.  Not currently taking any medications regularly. Has had labs 1 year ago - only notable result was low Vit D, will recheck and follow up as warranted  Pt admittedly not as physically active "as I should be". We discussed healthy activity levels and the benefits weight bearing activity can have for bone density.   Pt reports some symptoms of menopause starting - some hot flashes and night sweats. She is [facility] at [facility] with Donnel Saxon, CNM.    Patient Active Problem List   Diagnosis Date Noted  . Bilateral bunions 10/23/2013  . Allergic rhinitis, cause unspecified 10/23/2013  . Unspecified vitamin D deficiency 10/02/2012  . Health care maintenance 10/02/2012    Past Medical History:   Diagnosis Date  . Allergy     Current Outpatient Medications  Medication Sig Dispense Refill  . cetirizine (ZYRTEC) 10 MG tablet Take 10 mg by mouth daily.    [name] ibuprofen (ADVIL,MOTRIN) 200 MG tablet Take 200 mg by mouth.     No current facility-administered medications for this visit.     No Known Allergies  Social History   Socioeconomic History  . Marital status: Divorced    Spouse name: Not on file  . Number of children: Not on file  . Years of education: Not on file  . Highest education level: Not on file  Occupational History  . Occupation: Special educational needs teacher: [employer]  . Financial resource strain: Not hard at all  . Food insecurity:    Worry: Never true    Inability: Never true  . Transportation needs:    Medical: No    Non-medical: No  Tobacco Use  . Smoking status: Never Smoker  . Smokeless tobacco: Never Used  Substance and Sexual Activity  . Alcohol use: No    Alcohol/week: 0.0 standard drinks  . Drug use: No  . Sexual activity: Not Currently  Lifestyle  . Physical activity:    Days per week: 0 days    Minutes per session: 0 min  . Stress: To some extent  Relationships  . Social connections:    Talks on phone: More than three times a week    Gets together: Three times a week    Attends religious service: More than 4 times  per year    Active member of club or organization: No    Attends meetings of clubs or organizations: Never    Relationship status: Divorced  . Intimate partner violence:    Fear of current or ex partner: No    Emotionally abused: No    Physically abused: No    Forced sexual activity: No  Other Topics Concern  . Not on file  Social History Narrative  . Not on file    Review of Systems  Constitutional: Positive for diaphoresis (Menopause, see HPI).  HENT: Negative.   Eyes: Negative.   Respiratory: Negative.   Cardiovascular: Negative.   Gastrointestinal: Negative.   Genitourinary:  Negative.   Musculoskeletal: Negative.   Skin: Negative.   Neurological: Negative.   Endo/Heme/Allergies: Negative.   Psychiatric/Behavioral: Negative.     Objective   Vitals as reported by the patient: There were no vitals filed for this visit.  Chelsea [place] was seen today for transitions of care.  Diagnoses and all orders for this visit:  Encounter to establish care  PLAN:  Labs ordered. Pt asked to report for labs prior to CPE, suggest CPE within the next 6 months at patient's preference.   Patient should [facility] to [facility] with [facility] - is due for Pap soon, according to our records.   Patient encouraged to call clinic with any questions, comments, or concerns.     I discussed the assessment and treatment plan with the patient. The patient was provided an opportunity to ask questions and all were answered. The patient agreed with the plan and demonstrated an understanding of the instructions.   The patient was advised to call back or seek an in-person evaluation if the symptoms worsen or if the condition fails to improve as anticipated.  I provided 19 minutes of non-face-to-face time during this encounter, more than 50% of which was spent educating/counseling  Thank you for your visit with [facility] today.  Maximiano Coss, NP [facility] at [place], [place] [address] [phone]

## 2018-12-07 ENCOUNTER — Other Ambulatory Visit: Payer: Self-pay

## 2018-12-07 ENCOUNTER — Ambulatory Visit (INDEPENDENT_AMBULATORY_CARE_PROVIDER_SITE_OTHER): Payer: BC Managed Care – PPO | Admitting: Registered Nurse

## 2018-12-07 DIAGNOSIS — Z1329 Encounter for screening for other suspected endocrine disorder: Secondary | ICD-10-CM | POA: Diagnosis not present

## 2018-12-07 DIAGNOSIS — Z130 Encounter for screening for diseases of the blood and blood-forming organs and certain disorders involving the immune mechanism: Secondary | ICD-10-CM

## 2018-12-07 DIAGNOSIS — Z13220 Encounter for screening for lipoid disorders: Secondary | ICD-10-CM | POA: Diagnosis not present

## 2018-12-07 DIAGNOSIS — Z131 Encounter for screening for diabetes mellitus: Secondary | ICD-10-CM

## 2018-12-07 DIAGNOSIS — Z13228 Encounter for screening for other metabolic disorders: Secondary | ICD-10-CM | POA: Diagnosis not present

## 2018-12-07 DIAGNOSIS — E559 Vitamin D deficiency, unspecified: Secondary | ICD-10-CM | POA: Diagnosis not present

## 2018-12-07 LAB — LIPID PANEL

## 2018-12-07 NOTE — Progress Notes (Signed)
Labs only

## 2018-12-08 LAB — CBC
Hematocrit: 40.8 % (ref 34.0–46.6)
Hemoglobin: 13.2 g/dL (ref 11.1–15.9)
MCH: 29.2 pg (ref 26.6–33.0)
MCHC: 32.4 g/dL (ref 31.5–35.7)
MCV: 90 fL (ref 79–97)
Platelets: 219 10*3/uL (ref 150–450)
RBC: 4.52 x10E6/uL (ref 3.77–5.28)
RDW: 11.6 % — ABNORMAL LOW (ref 11.7–15.4)
WBC: 8 10*3/uL (ref 3.4–10.8)

## 2018-12-08 LAB — COMPREHENSIVE METABOLIC PANEL
ALT: 11 IU/L (ref 0–32)
AST: 15 IU/L (ref 0–40)
Albumin/Globulin Ratio: 1.5 (ref 1.2–2.2)
Albumin: 4.4 g/dL (ref 3.8–4.8)
Alkaline Phosphatase: 55 IU/L (ref 39–117)
BUN/Creatinine Ratio: 11 (ref 9–23)
BUN: 12 mg/dL (ref 6–24)
Bilirubin Total: 0.4 mg/dL (ref 0.0–1.2)
CO2: 18 mmol/L — ABNORMAL LOW (ref 20–29)
Calcium: 9.1 mg/dL (ref 8.7–10.2)
Chloride: 104 mmol/L (ref 96–106)
Creatinine, Ser: 1.11 mg/dL — ABNORMAL HIGH (ref 0.57–1.00)
GFR calc Af Amer: 68 mL/min/{1.73_m2} (ref >59)
GFR calc non Af Amer: 59 mL/min/{1.73_m2} — ABNORMAL LOW (ref >59)
Globulin, Total: 2.9 g/dL (ref 1.5–4.5)
Glucose: 102 mg/dL — ABNORMAL HIGH (ref 65–99)
Potassium: 4.2 mmol/L (ref 3.5–5.2)
Sodium: 139 mmol/L (ref 134–144)
Total Protein: 7.3 g/dL (ref 6.0–8.5)

## 2018-12-08 LAB — LIPID PANEL
Chol/HDL Ratio: 3.1 ratio (ref 0.0–4.4)
Cholesterol, Total: 156 mg/dL (ref 100–199)
HDL: 50 mg/dL (ref >39)
LDL Calculated: 92 mg/dL (ref 0–99)
Triglycerides: 68 mg/dL (ref 0–149)
VLDL Cholesterol Cal: 14 mg/dL (ref 5–40)

## 2018-12-08 LAB — HEMOGLOBIN A1C
Est. average glucose Bld gHb Est-mCnc: 91 mg/dL
Hgb A1c MFr Bld: 4.8 % (ref 4.8–5.6)

## 2018-12-08 LAB — TSH: TSH: 1.41 u[IU]/mL (ref 0.450–4.500)

## 2018-12-08 LAB — VITAMIN D 25 HYDROXY (VIT D DEFICIENCY, FRACTURES): Vit D, 25-Hydroxy: 9.8 ng/mL — ABNORMAL LOW (ref 30.0–100.0)

## 2018-12-10 ENCOUNTER — Other Ambulatory Visit: Payer: Self-pay | Admitting: Registered Nurse

## 2018-12-10 DIAGNOSIS — E559 Vitamin D deficiency, unspecified: Secondary | ICD-10-CM

## 2018-12-10 MED ORDER — VITAMIN D (ERGOCALCIFEROL) 1.25 MG (50000 UNIT) PO CAPS: 50000.0000 [IU] | ORAL_CAPSULE | ORAL | 0 refills | Status: DC

## 2018-12-10 NOTE — Progress Notes (Signed)
Pt VitD returned at 9.8 ng/mL, unfortunately a continued decline since previous year's already low results in 14s.  Pt will start on Vit D supplement: ergocalciferol 50,000 Units capsule PO once weekly for six weeks. We will then recheck VitD and follow up as warranted. Pt called to discuss results and verbalized understanding.   Kathrin Ruddy, NP

## 2018-12-31 ENCOUNTER — Ambulatory Visit (INDEPENDENT_AMBULATORY_CARE_PROVIDER_SITE_OTHER): Payer: BC Managed Care – PPO | Admitting: Registered Nurse

## 2018-12-31 ENCOUNTER — Encounter: Payer: Self-pay | Admitting: Registered Nurse

## 2018-12-31 ENCOUNTER — Other Ambulatory Visit: Payer: Self-pay

## 2018-12-31 VITALS — BP 144/88 | HR 107 | Temp 98.0°F | Resp 16 | Ht 64.57 in | Wt 209.0 lb

## 2018-12-31 DIAGNOSIS — E559 Vitamin D deficiency, unspecified: Secondary | ICD-10-CM | POA: Diagnosis not present

## 2018-12-31 DIAGNOSIS — Z0001 Encounter for general adult medical examination with abnormal findings: Secondary | ICD-10-CM | POA: Diagnosis not present

## 2018-12-31 DIAGNOSIS — Z0000 Encounter for general adult medical examination without abnormal findings: Secondary | ICD-10-CM

## 2018-12-31 NOTE — Progress Notes (Signed)
Established Patient Office Visit  Subjective:  Patient ID: Chelsea [place], female    DOB: 1971/03/10  Age: 48 y.o. MRN: [id number]  CC:  Chief Complaint  Patient presents with  . Annual Exam    HPI Chelsea [place] is a very pleasant 48 y.o. female presenting today for CPE.   She recently had a visit to establish care with myself as PCP. She received labs in the interim - which came back showing a vit D of 9.8. For this, she was started on 50,000 Units of VitD PO weekly for a six week period. She has been taking these supplements for around 3 weeks now and reports no issues. She will return to clinic in around 3 weeks to re-check her Vit D levels. In the mean time, we discussed diet and exercise to maintain bone density and improve Vit D.  She is due for a Pap soon, but will see her gynecologist for this.   She reports some concern for weight gain, but this is related to COVID-19 lifestyle changes she has had to make. She is working on her diet and exercise to help this.  She reports some nasal and sinus congestion recently - she has taken Zyrtec and Sudafed with good effect. We discussed that this is likely why her BP is elevated at today's visit, but we will recheck this in 3 weeks when she presents for Vit D lab to be sure.   Past Medical History:  Diagnosis Date  . Allergy     Past Surgical History:  Procedure Laterality Date  . BUNIONECTOMY    . BUNIONECTOMY Left 12/21/2013   Dr. Craig Staggers [facility]  . BUNIONECTOMY Right 06/17/2014   Dr. Ila Mcgill  . CESAREAN SECTION     1) 11/2003  2)02/2005    Family History  Problem Relation Age of Onset  . Hypertension Mother   . Heart disease Maternal Grandfather   . Stroke Maternal Grandfather   . Diabetes Paternal Grandmother     Social History   Socioeconomic History  . Marital status: Divorced    Spouse name: Not on file  . Number of children: Not on file  . Years of education: Not on file  . Highest education level: Not on  file  Occupational History  . Occupation: Special educational needs teacher: [employer]  . Financial resource strain: Not hard at all  . Food insecurity    Worry: Never true    Inability: Never true  . Transportation needs    Medical: No    Non-medical: No  Tobacco Use  . Smoking status: Never Smoker  . Smokeless tobacco: Never Used  Substance and Sexual Activity  . Alcohol use: No    Alcohol/week: 0.0 standard drinks  . Drug use: No  . Sexual activity: Not Currently  Lifestyle  . Physical activity    Days per week: 0 days    Minutes per session: 0 min  . Stress: To some extent  Relationships  . Social connections    Talks on phone: More than three times a week    Gets together: Three times a week    Attends religious service: More than 4 times per year    Active member of club or organization: No    Attends meetings of clubs or organizations: Never    Relationship status: Divorced  . Intimate partner violence    Fear of current or ex partner: No  Emotionally abused: No    Physically abused: No    Forced sexual activity: No  Other Topics Concern  . Not on file  Social History Narrative  . Not on file    Outpatient Medications Prior to Visit  Medication Sig Dispense Refill  . cetirizine (ZYRTEC) 10 MG tablet Take 10 mg by mouth daily.    [name] ibuprofen (ADVIL,MOTRIN) 200 MG tablet Take 200 mg by mouth.    . Vitamin D, Ergocalciferol, (DRISDOL) 1.25 MG (50000 UT) CAPS capsule Take 1 capsule (50,000 Units total) by mouth every 7 (seven) days. 6 capsule 0   No facility-administered medications prior to visit.     No Known Allergies  ROS Review of Systems  Constitutional: Negative.   HENT: Positive for congestion and sinus pressure.   Eyes: Negative.   Respiratory: Negative.   Cardiovascular: Negative.   Gastrointestinal: Negative.   Endocrine: Negative.   Genitourinary: Negative.   Musculoskeletal: Negative.   Skin: Negative.    Allergic/Immunologic: Negative.   Neurological: Negative.   Hematological: Negative.   Psychiatric/Behavioral: Negative.       Objective:    Physical Exam  Constitutional: She is oriented to person, place, and time. She appears well-developed and well-nourished. No distress.  HENT:  Head: Normocephalic and atraumatic.  Right Ear: External ear normal.  Left Ear: External ear normal.  Nose: Nose normal.  Mouth/Throat: Oropharynx is clear and moist. No oropharyngeal exudate.  Eyes: Pupils are equal, round, and reactive to light. Conjunctivae and EOM are normal. Right eye exhibits no discharge. Left eye exhibits no discharge. No scleral icterus.  Neck: Normal range of motion. Neck supple. No tracheal deviation present. No thyromegaly present.  Cardiovascular: Normal rate, regular rhythm and intact distal pulses. Exam reveals no gallop and no friction rub.  No murmur heard. Pulmonary/Chest: Effort normal and breath sounds normal. No respiratory distress. She has no wheezes. She has no rales. She exhibits no tenderness.  Abdominal: Soft. Bowel sounds are normal. She exhibits no distension and no mass. There is no abdominal tenderness. There is no rebound and no guarding.  Genitourinary:    Genitourinary Comments: deferred   Musculoskeletal: Normal range of motion.        General: No tenderness, deformity or edema.  Lymphadenopathy:    She has no cervical adenopathy.  Neurological: She is alert and oriented to person, place, and time. No cranial nerve deficit. She exhibits normal muscle tone. Coordination normal.  Skin: Skin is warm and dry. No rash noted. She is not diaphoretic. No erythema. No pallor.  Psychiatric: She has a normal mood and affect. Her behavior is normal. Judgment and thought content normal.  Nursing note and vitals reviewed.   BP (!) 144/88   Pulse (!) 107   Temp 98 F (36.7 C) (Oral)   Resp 16   Ht 5' 4.57" (1.64 m)   Wt 209 lb (94.8 kg)   SpO2 98%   BMI  35.25 kg/m  Wt Readings from Last 3 Encounters:  12/31/18 209 lb (94.8 kg)  11/24/17 195 lb 6.4 oz (88.6 kg)  08/23/16 199 lb 9.6 oz (90.5 kg)     There are no preventive care reminders to display for this patient.  There are no preventive care reminders to display for this patient.  Lab Results  Component Value Date   TSH 1.410 12/07/2018   Lab Results  Component Value Date   WBC 8.0 12/07/2018   HGB 13.2 12/07/2018   HCT 40.8 12/07/2018  MCV 90 12/07/2018   PLT 219 12/07/2018   Lab Results  Component Value Date   NA 139 12/07/2018   K 4.2 12/07/2018   CO2 18 (L) 12/07/2018   GLUCOSE 102 (H) 12/07/2018   BUN 12 12/07/2018   CREATININE 1.11 (H) 12/07/2018   BILITOT 0.4 12/07/2018   ALKPHOS 55 12/07/2018   AST 15 12/07/2018   ALT 11 12/07/2018   PROT 7.3 12/07/2018   ALBUMIN 4.4 12/07/2018   CALCIUM 9.1 12/07/2018   Lab Results  Component Value Date   CHOL 156 12/07/2018   Lab Results  Component Value Date   HDL 50 12/07/2018   Lab Results  Component Value Date   LDLCALC 92 12/07/2018   Lab Results  Component Value Date   TRIG 68 12/07/2018   Lab Results  Component Value Date   CHOLHDL 3.1 12/07/2018   Lab Results  Component Value Date   HGBA1C 4.8 12/07/2018      Assessment & Plan:   Problem List Items Addressed This Visit    None    Visit Diagnoses    Routine general medical examination at a health care facility    -  Primary      No orders of the defined types were placed in this encounter.   Follow-up: No follow-ups on file.    Maximiano Coss, NP

## 2018-12-31 NOTE — Patient Instructions (Addendum)
   If you have lab work done today you will be contacted with your lab results within the next 2 weeks.  If you have not heard from us then please contact us. The fastest way to get your results is to register for My Chart.   IF you received an x-ray today, you will receive an invoice from [facility]. Please contact [facility] at [phone] with questions or concerns regarding your invoice.   IF you received labwork today, you will receive an invoice from [facility]. Please contact [facility] at [phone] with questions or concerns regarding your invoice.   Our billing staff will not be able to assist you with questions regarding bills from these companies.  You will be contacted with the lab results as soon as they are available. The fastest way to get your results is to activate your My Chart account. Instructions are located on the last page of this paperwork. If you have not heard from us regarding the results in 2 weeks, please contact this office.       Health Maintenance, Female Adopting a healthy lifestyle and getting preventive care are important in promoting health and wellness. Ask your health care provider about:  The right schedule for you to have regular tests and exams.  Things you can do on your own to prevent diseases and keep yourself healthy. What should I know about diet, weight, and exercise? Eat a healthy diet   Eat a diet that includes plenty of vegetables, fruits, low-fat dairy products, and lean protein.  Do not eat a lot of foods that are high in solid fats, added sugars, or sodium. Maintain a healthy weight Body mass index (BMI) is used to identify weight problems. It estimates body fat based on height and weight. Your health care provider can help determine your BMI and help you achieve or maintain a healthy weight. Get regular exercise Get regular exercise. This is one of the most important things you can do for your health. Most  adults should:  Exercise for at least 150 minutes each week. The exercise should increase your heart rate and make you sweat (moderate-intensity exercise).  Do strengthening exercises at least twice a week. This is in addition to the moderate-intensity exercise.  Spend less time sitting. Even light physical activity can be beneficial. Watch cholesterol and blood lipids Have your blood tested for lipids and cholesterol at 48 years of age, then have this test every 5 years. Have your cholesterol levels checked more often if:  Your lipid or cholesterol levels are high.  You are older than 48 years of age.  You are at high risk for heart disease. What should I know about cancer screening? Depending on your health history and family history, you may need to have cancer screening at various ages. This may include screening for:  Breast cancer.  Cervical cancer.  Colorectal cancer.  Skin cancer.  Lung cancer. What should I know about heart disease, diabetes, and high blood pressure? Blood pressure and heart disease  High blood pressure causes heart disease and increases the risk of stroke. This is more likely to develop in people who have high blood pressure readings, are of African descent, or are overweight.  Have your blood pressure checked: ? Every 3-5 years if you are 18-39 years of age. ? Every year if you are 40 years old or older. Diabetes Have regular diabetes screenings. This checks your fasting blood sugar level. Have the screening done:  Once   every three years after age 40 if you are at a normal weight and have a low risk for diabetes.  More often and at a younger age if you are overweight or have a high risk for diabetes. What should I know about preventing infection? Hepatitis B If you have a higher risk for hepatitis B, you should be screened for this virus. Talk with your health care provider to find out if you are at risk for hepatitis B infection. Hepatitis  C Testing is recommended for:  Everyone born from 1945 through 1965.  Anyone with known risk factors for hepatitis C. Sexually transmitted infections (STIs)  Get screened for STIs, including gonorrhea and chlamydia, if: ? You are sexually active and are younger than 48 years of age. ? You are older than 48 years of age and your health care provider tells you that you are at risk for this type of infection. ? Your sexual activity has changed since you were last screened, and you are at increased risk for chlamydia or gonorrhea. Ask your health care provider if you are at risk.  Ask your health care provider about whether you are at high risk for HIV. Your health care provider may recommend a prescription medicine to help prevent HIV infection. If you choose to take medicine to prevent HIV, you should first get tested for HIV. You should then be tested every 3 months for as long as you are taking the medicine. Pregnancy  If you are about to stop having your period (premenopausal) and you may become pregnant, seek counseling before you get pregnant.  Take 400 to 800 micrograms (mcg) of folic acid every day if you become pregnant.  Ask for birth control (contraception) if you want to prevent pregnancy. Osteoporosis and menopause Osteoporosis is a disease in which the bones lose minerals and strength with aging. This can result in bone fractures. If you are 65 years old or older, or if you are at risk for osteoporosis and fractures, ask your health care provider if you should:  Be screened for bone loss.  Take a calcium or vitamin D supplement to lower your risk of fractures.  Be given hormone replacement therapy (HRT) to treat symptoms of menopause. Follow these instructions at home: Lifestyle  Do not use any products that contain nicotine or tobacco, such as cigarettes, e-cigarettes, and chewing tobacco. If you need help quitting, ask your health care provider.  Do not use street  drugs.  Do not share needles.  Ask your health care provider for help if you need support or information about quitting drugs. Alcohol use  Do not drink alcohol if: ? Your health care provider tells you not to drink. ? You are pregnant, may be pregnant, or are planning to become pregnant.  If you drink alcohol: ? Limit how much you use to 0-1 drink a day. ? Limit intake if you are breastfeeding.  Be aware of how much alcohol is in your drink. In the [place] one drink equals one 12 oz bottle of beer (355 mL), one 5 oz glass of wine (148 mL), or one 1 oz glass of hard liquor (44 mL). General instructions  Schedule regular health, dental, and eye exams.  Stay current with your vaccines.  Tell your health care provider if: ? You often feel depressed. ? You have ever been abused or do not feel safe at home. Summary  Adopting a healthy lifestyle and getting preventive care are important in promoting health and   wellness.  Follow your health care provider's instructions about healthy diet, exercising, and getting tested or screened for diseases.  Follow your health care provider's instructions on monitoring your cholesterol and blood pressure. This information is not intended to replace advice given to you by your health care provider. Make sure you discuss any questions you have with your health care provider. Document Released: 01/03/2011 Document Revised: 06/13/2018 Document Reviewed: 06/13/2018 Elsevier Patient Education  2020 [employer].     Why follow it? Research shows. . Those who follow the Mediterranean diet have a reduced risk of heart disease  . The diet is associated with a reduced incidence of Parkinson's and Alzheimer's diseases . People following the diet may have longer life expectancies and lower rates of chronic diseases  . The Dietary Guidelines for Americans recommends the Mediterranean diet as an eating plan to promote health and prevent disease  What Is the  Mediterranean Diet?  . Healthy eating plan based on typical foods and recipes of Mediterranean-style cooking . The diet is primarily a plant based diet; these foods should make up a majority of meals   Starches - Plant based foods should make up a majority of meals - They are an important sources of vitamins, minerals, energy, antioxidants, and fiber - Choose whole grains, foods high in fiber and minimally processed items  - Typical grain sources include wheat, oats, barley, corn, brown rice, bulgar, farro, millet, polenta, couscous  - Various types of beans include chickpeas, lentils, fava beans, black beans, white beans   Fruits  Veggies - Large quantities of antioxidant rich fruits & veggies; 6 or more servings  - Vegetables can be eaten raw or lightly drizzled with oil and cooked  - Vegetables common to the traditional Mediterranean Diet include: artichokes, arugula, beets, broccoli, brussel sprouts, cabbage, carrots, celery, collard greens, cucumbers, eggplant, kale, leeks, lemons, lettuce, mushrooms, okra, onions, peas, peppers, potatoes, pumpkin, radishes, rutabaga, shallots, spinach, sweet potatoes, turnips, zucchini - Fruits common to the Mediterranean Diet include: apples, apricots, avocados, cherries, clementines, dates, figs, grapefruits, grapes, melons, nectarines, oranges, peaches, pears, pomegranates, strawberries, tangerines  Fats - Replace butter and margarine with healthy oils, such as olive oil, canola oil, and tahini  - Limit nuts to no more than a handful a day  - Nuts include walnuts, almonds, pecans, pistachios, pine nuts  - Limit or avoid candied, honey roasted or heavily salted nuts - Olives are central to the [employer] - can be eaten whole or used in a variety of dishes   Meats Protein - Limiting red meat: no more than a few times a month - When eating red meat: choose lean cuts and keep the portion to the size of deck of cards - Eggs: approx. 0 to 4 times a  week  - Fish and lean poultry: at least 2 a week  - Healthy protein sources include, chicken, [place], lean beef, lamb - Increase intake of seafood such as tuna, salmon, trout, mackerel, shrimp, scallops - Avoid or limit high fat processed meats such as sausage and bacon  Dairy - Include moderate amounts of low fat dairy products  - Focus on healthy dairy such as fat free yogurt, skim milk, low or reduced fat cheese - Limit dairy products higher in fat such as whole or 2% milk, cheese, ice cream  Alcohol - Moderate amounts of red wine is ok  - No more than 5 oz daily for women (all ages) and men older than age 80  -  No more than 10 oz of wine daily for men younger than 53  Other - Limit sweets and other desserts  - Use herbs and spices instead of salt to flavor foods  - Herbs and spices common to the traditional Mediterranean Diet include: basil, bay leaves, chives, cloves, cumin, fennel, garlic, lavender, marjoram, mint, oregano, parsley, pepper, rosemary, sage, savory, sumac, tarragon, thyme   It's not just a diet, it's a lifestyle:  . The Mediterranean diet includes lifestyle factors typical of those in the region  . Foods, drinks and meals are best eaten with others and savored . Daily physical activity is important for overall good health . This could be strenuous exercise like running and aerobics . This could also be more leisurely activities such as walking, housework, yard-work, or taking the stairs . Moderation is the key; a balanced and healthy diet accommodates most foods and drinks . Consider portion sizes and frequency of consumption of certain foods   Meal Ideas & Options:  . Breakfast:  o Whole wheat toast or whole wheat English muffins with peanut butter & hard boiled egg o Steel cut oats topped with apples & cinnamon and skim milk  o Fresh fruit: banana, strawberries, melon, berries, peaches  o Smoothies: strawberries, bananas, greek yogurt, peanut butter o Low fat  greek yogurt with blueberries and granola  o Egg white omelet with spinach and mushrooms o Breakfast couscous: whole wheat couscous, apricots, skim milk, cranberries  . Sandwiches:  o Hummus and grilled vegetables (peppers, zucchini, squash) on whole wheat bread   o Grilled chicken on whole wheat pita with lettuce, tomatoes, cucumbers or tzatziki  o Tuna salad on whole wheat bread: tuna salad made with greek yogurt, olives, red peppers, capers, [place] onions o Garlic rosemary lamb pita: lamb sauted with garlic, rosemary, salt & pepper; add lettuce, cucumber, greek yogurt to pita - flavor with lemon juice and black pepper  . Seafood:  o Mediterranean grilled salmon, seasoned with garlic, basil, parsley, lemon juice and black pepper o Shrimp, lemon, and spinach whole-grain pasta salad made with low fat greek yogurt  o Seared scallops with lemon orzo  o Seared tuna steaks seasoned salt, pepper, coriander topped with tomato mixture of olives, tomatoes, olive oil, minced garlic, parsley, [place] onions and cappers  . Meats:  o Herbed greek chicken salad with kalamata olives, cucumber, feta  o Red bell peppers stuffed with spinach, bulgur, lean ground beef (or lentils) & topped with feta   o Kebabs: skewers of chicken, tomatoes, onions, zucchini, squash  o [place] burgers: made with red onions, mint, dill, lemon juice, feta cheese topped with roasted red peppers . Vegetarian o Cucumber salad: cucumbers, artichoke hearts, celery, red onion, feta cheese, tossed in olive oil & lemon juice  o Hummus and whole grain pita points with a greek salad (lettuce, tomato, feta, olives, cucumbers, red onion) o Lentil soup with celery, carrots made with vegetable broth, garlic, salt and pepper  o Tabouli salad: parsley, bulgur, mint, scallions, cucumbers, tomato, radishes, lemon juice, olive oil, salt and pepper.       Fat and Cholesterol Restricted Eating Plan Eating a diet that limits fat and cholesterol may  help lower your risk for heart disease and other conditions. Your body needs fat and cholesterol for basic functions, but eating too much of these things can be harmful to your health. Your health care provider may order lab tests to check your blood fat (lipid) and cholesterol levels. This helps your  health care provider understand your risk for certain conditions and whether you need to make diet changes. Work with your health care provider or dietitian to make an eating plan that is right for you. Your plan includes:  Limit your fat intake to ______% or less of your total calories a day.  Limit your saturated fat intake to ______% or less of your total calories a day.  Limit the amount of cholesterol in your diet to less than _________mg a day.  Eat ___________ g of fiber a day. What are tips for following this plan? General guidelines   If you are overweight, work with your health care provider to lose weight safely. Losing just 5-10% of your body weight can improve your overall health and help prevent diseases such as diabetes and heart disease.  Avoid: ? Foods with added sugar. ? Fried foods. ? Foods that contain partially hydrogenated oils, including stick margarine, some tub margarines, cookies, crackers, and other baked goods.  Limit alcohol intake to no more than 1 drink a day for nonpregnant women and 2 drinks a day for men. One drink equals 12 oz of beer, 5 oz of wine, or 1 oz of hard liquor. Reading food labels  Check food labels for: ? Trans fats, partially hydrogenated oils, or high amounts of saturated fat. Avoid foods that contain saturated fat and trans fat. ? The amount of cholesterol in each serving. Try to eat no more than 200 mg of cholesterol each day. ? The amount of fiber in each serving. Try to eat at least 20-30 g of fiber each day.  Choose foods with healthy fats, such as: ? Monounsaturated and polyunsaturated fats. These include olive and canola oil,  flaxseeds, walnuts, almonds, and seeds. ? Omega-3 fats. These are found in foods such as salmon, mackerel, sardines, tuna, flaxseed oil, and ground flaxseeds.  Choose grain products that have whole grains. Look for the word "whole" as the first word in the ingredient list. Cooking  Cook foods using methods other than frying. Baking, boiling, grilling, and broiling are some healthy options.  Eat more home-cooked food and less restaurant, buffet, and fast food.  Avoid cooking using saturated fats. ? Animal sources of saturated fats include meats, butter, and cream. ? Plant sources of saturated fats include palm oil, palm kernel oil, and coconut oil. Meal planning   At meals, imagine dividing your plate into fourths: ? Fill one-half of your plate with vegetables and [place] salads. ? Fill one-fourth of your plate with whole grains. ? Fill one-fourth of your plate with lean protein foods.  Eat fish that is high in omega-3 fats at least two times a week.  Eat more foods that contain fiber, such as whole grains, beans, apples, broccoli, carrots, peas, and barley. These foods help promote healthy cholesterol levels in the blood. Recommended foods Grains  Whole grains, such as whole wheat or whole grain breads, crackers, cereals, and pasta. Unsweetened oatmeal, bulgur, barley, quinoa, or brown rice. Corn or whole wheat flour tortillas. Vegetables  Fresh or frozen vegetables (raw, steamed, roasted, or grilled). [place] salads. Fruits  All fresh, canned (in natural juice), or frozen fruits. Meats and other protein foods  Ground beef (85% or leaner), grass-fed beef, or beef trimmed of fat. Skinless chicken or [place]. Ground chicken or [place]. Pork trimmed of fat. All fish and seafood. Egg whites. Dried beans, peas, or lentils. Unsalted nuts or seeds. Unsalted canned beans. Natural nut butters without added sugar and oil. Dairy  Low-fat or nonfat dairy products, such as skim or 1% milk, 2% or  reduced-fat cheeses, low-fat and fat-free ricotta or cottage cheese, or plain low-fat and nonfat yogurt. Fats and oils  Tub margarine without trans fats. Light or reduced-fat mayonnaise and salad dressings. Avocado. Olive, canola, sesame, or safflower oils. The items listed above may not be a complete list of recommended foods or beverages. Contact your dietitian for more options. Foods to avoid Grains  White bread. White pasta. White rice. Cornbread. Bagels, pastries, and croissants. Crackers and snack foods that contain trans fat and hydrogenated oils. Vegetables  Vegetables cooked in cheese, cream, or butter sauce. Fried vegetables. Fruits  Canned fruit in heavy syrup. Fruit in cream or butter sauce. Fried fruit. Meats and other protein foods  Fatty cuts of meat. Ribs, chicken wings, bacon, sausage, bologna, salami, chitterlings, fatback, hot dogs, bratwurst, and packaged lunch meats. Liver and organ meats. Whole eggs and egg yolks. Chicken and [place] with skin. Fried meat. Dairy  Whole or 2% milk, cream, half-and-half, and cream cheese. Whole milk cheeses. Whole-fat or sweetened yogurt. Full-fat cheeses. Nondairy creamers and whipped toppings. Processed cheese, cheese spreads, and cheese curds. Beverages  Alcohol. Sugar-sweetened drinks such as sodas, lemonade, and fruit drinks. Fats and oils  Butter, stick margarine, lard, shortening, ghee, or bacon fat. Coconut, palm kernel, and palm oils. Sweets and desserts  Corn syrup, sugars, honey, and molasses. Candy. Jam and jelly. Syrup. Sweetened cereals. Cookies, pies, cakes, donuts, muffins, and ice cream. The items listed above may not be a complete list of foods and beverages to avoid. Contact your dietitian for more information. Summary  Your body needs fat and cholesterol for basic functions. However, eating too much of these things can be harmful to your health.  Work with your health care provider and dietitian to follow a  diet low in fat and cholesterol. Doing this may help lower your risk for heart disease and other conditions.  Choose healthy fats, such as monounsaturated and polyunsaturated fats, and foods high in omega-3 fatty acids.  Eat fiber-rich foods, such as whole grains, beans, peas, fruits, and vegetables.  Limit or avoid alcohol, fried foods, and foods high in saturated fats, partially hydrogenated oils, and sugar. This information is not intended to replace advice given to you by your health care provider. Make sure you discuss any questions you have with your health care provider. Document Released: 06/20/2005 Document Revised: 06/02/2017 Document Reviewed: 03/07/2017 Elsevier Patient Education  2020 [place] (AHA) Exercise Recommendation  Being physically active is important to prevent heart disease and stroke, the nation's No. 1and No. 5killers. To improve overall cardiovascular health, we suggest at least 150 minutes per week of moderate exercise or 75 minutes per week of vigorous exercise (or a combination of moderate and vigorous activity). Thirty minutes a day, five times a week is an easy goal to remember. You will also experience benefits even if you divide your time into two or three segments of 10 to 15 minutes per day.  For people who would benefit from lowering their blood pressure or cholesterol, we recommend 40 minutes of aerobic exercise of moderate to vigorous intensity three to four times a week to lower the risk for heart attack and stroke.  Physical activity is anything that makes you move your body and burn calories.  This includes things like climbing stairs or playing sports. Aerobic exercises benefit your heart, and include walking, jogging, swimming or biking. Strength and  stretching exercises are best for overall stamina and flexibility.  The simplest, positive change you can make to effectively improve your heart health is to start  walking. It's enjoyable, free, easy, social and great exercise. A walking program is flexible and boasts high success rates because people can stick with it. It's easy for walking to become a regular and satisfying part of life.   For Overall Cardiovascular Health:  At least 30 minutes of moderate-intensity aerobic activity at least 5 days per week for a total of 150  OR   At least 25 minutes of vigorous aerobic activity at least 3 days per week for a total of 75 minutes; or a combination of moderate- and vigorous-intensity aerobic activity  AND   Moderate- to high-intensity muscle-strengthening activity at least 2 days per week for additional health benefits.  For Lowering Blood Pressure and Cholesterol  An average 40 minutes of moderate- to vigorous-intensity aerobic activity 3 or 4 times per week  What if I can't make it to the time goal? Something is always better than nothing! And everyone has to start somewhere. Even if you've been sedentary for years, today is the day you can begin to make healthy changes in your life. If you don't think you'll make it for 30 or 40 minutes, set a reachable goal for today. You can work up toward your overall goal by increasing your time as you get stronger. Don't let all-or-nothing thinking rob you of doing what you can every day.  Source:http://www.heart.org

## 2019-01-03 ENCOUNTER — Ambulatory Visit
Admission: RE | Admit: 2019-01-03 | Discharge: 2019-01-03 | Disposition: A | Discharge status: Home / Self Care | Payer: BC Managed Care – PPO | Source: Ambulatory Visit | Attending: Obstetrics and Gynecology | Admitting: Obstetrics and Gynecology

## 2019-01-03 ENCOUNTER — Other Ambulatory Visit: Payer: Self-pay

## 2019-01-03 DIAGNOSIS — Z1231 Encounter for screening mammogram for malignant neoplasm of breast: Secondary | ICD-10-CM

## 2019-01-03 IMAGING — MG DIGITAL SCREENING BILATERAL MAMMOGRAM WITH TOMO AND CAD
8 series · 8 of 24 positions shown · non-contrast
Comparison: Previous exam(s).

CLINICAL DATA: Screening.

EXAM:
DIGITAL SCREENING BILATERAL MAMMOGRAM WITH TOMO AND CAD

[R MLO synth-2D]
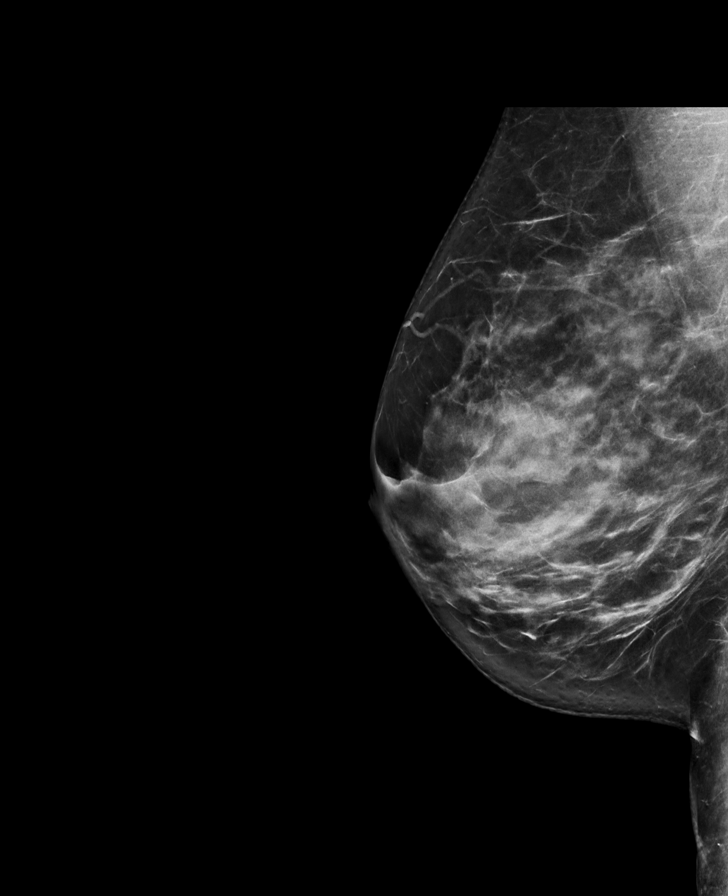

[R CC synth-2D]
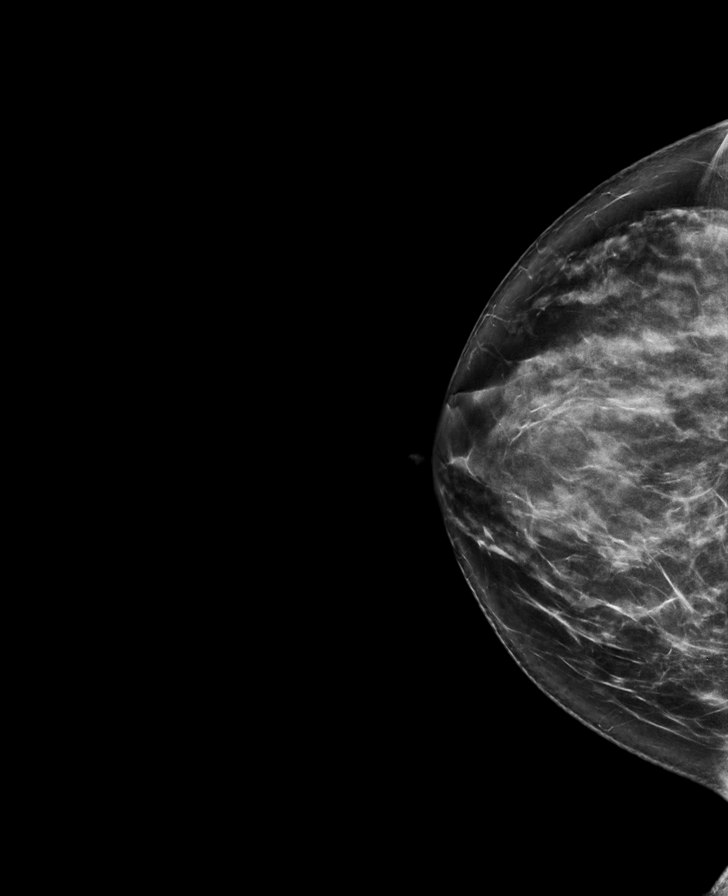

[L MLO synth-2D]
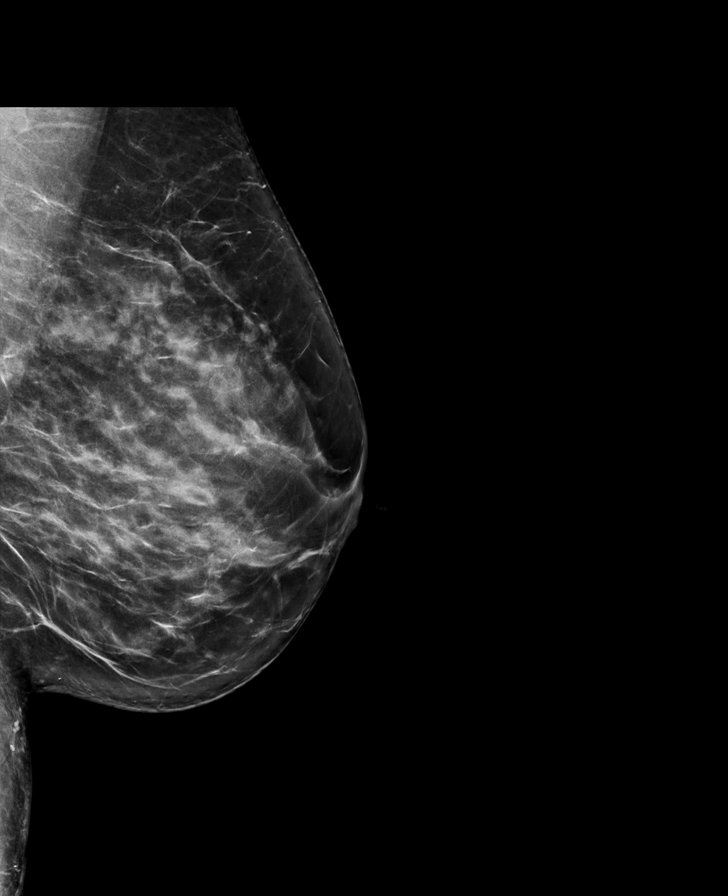

[L CC synth-2D]
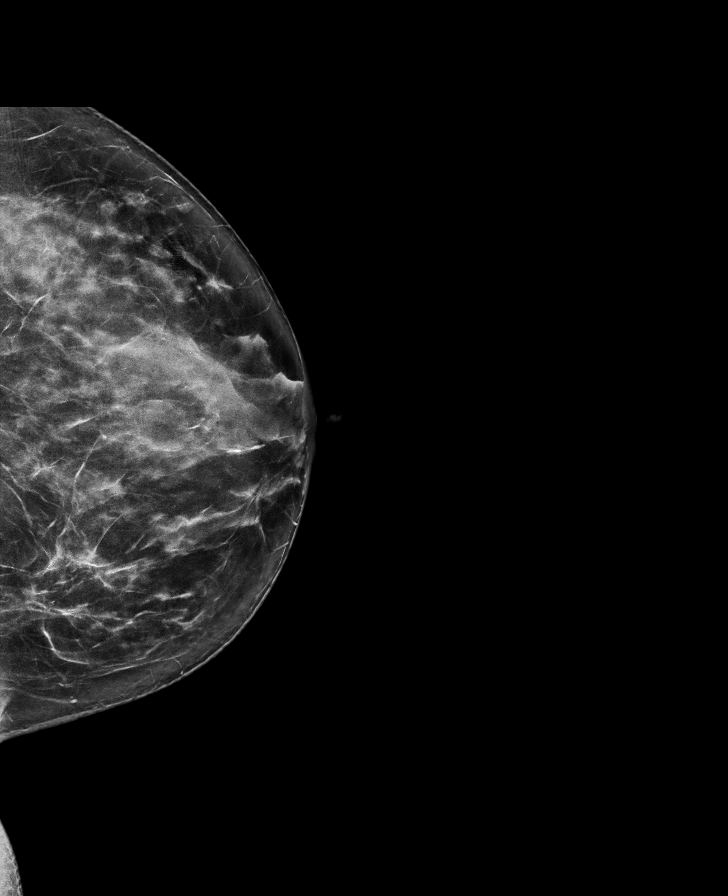

[R CC tomo · tomo slice 42/83.0]
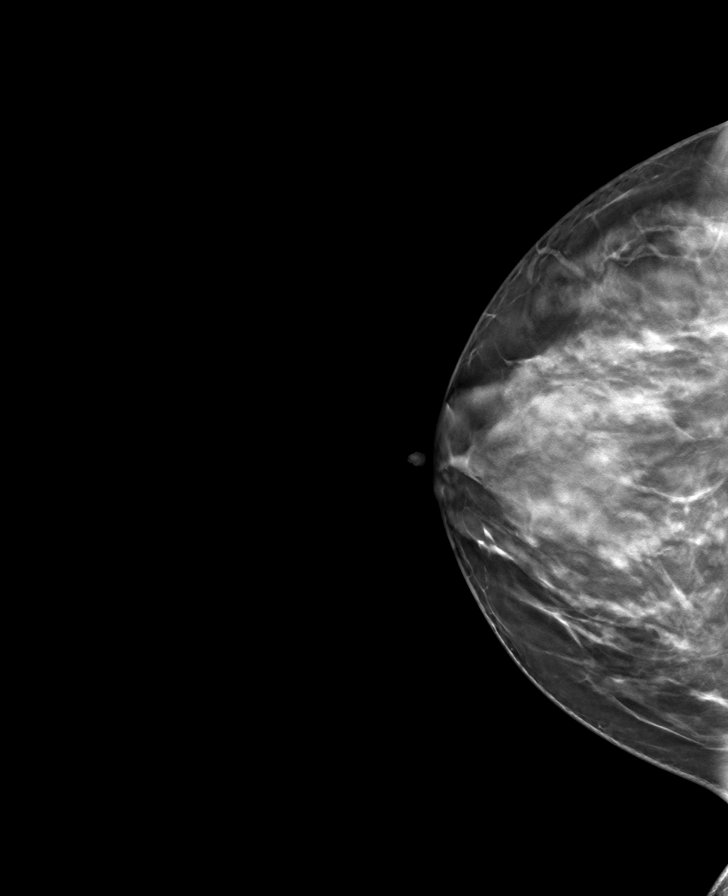

[R MLO tomo · tomo slice 42/83.0]
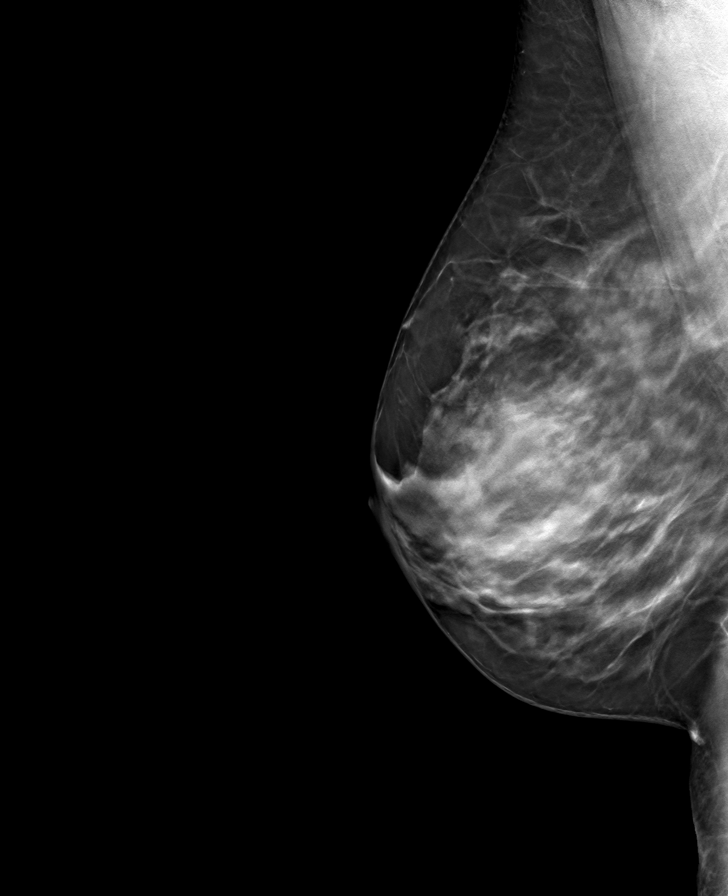

[L CC tomo · tomo slice 41/82.0]
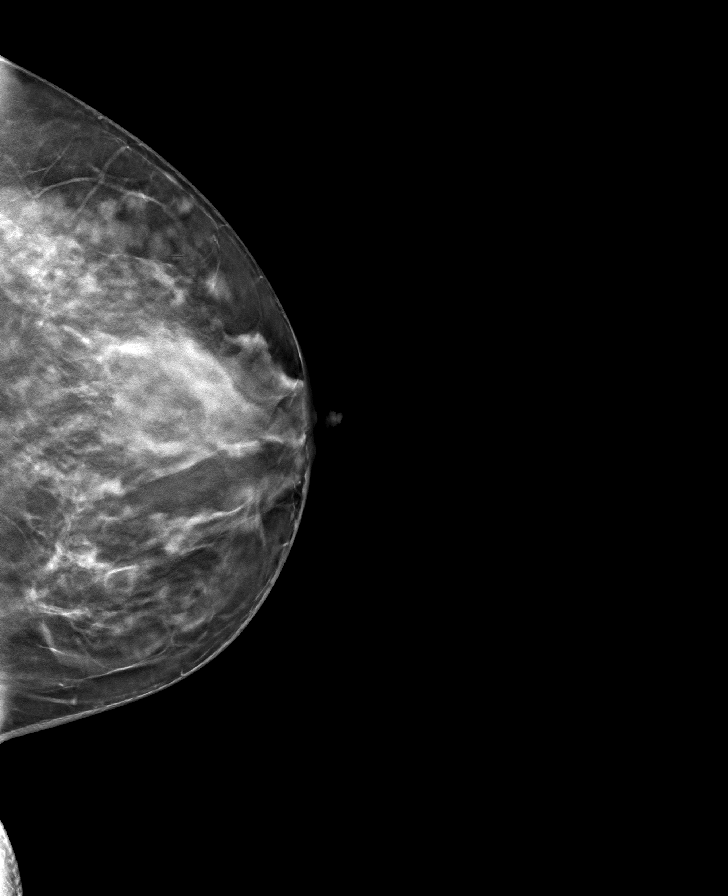

[L MLO tomo · tomo slice 43/85.0]
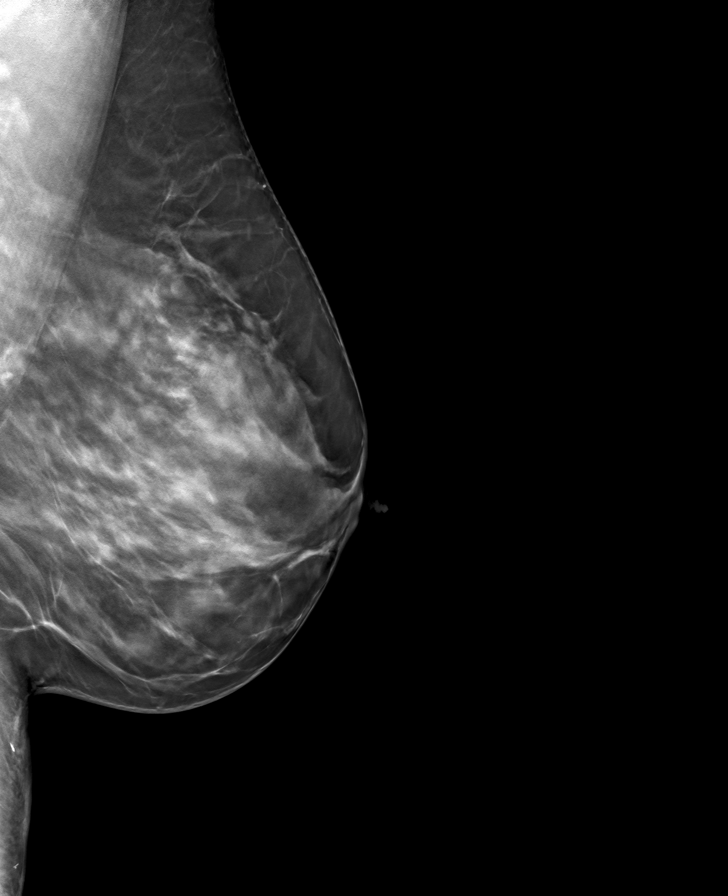

[8 of 24 positions shown; findings below may reference images not displayed]

ACR Breast Density Category d: The breast tissue is extremely dense,
which lowers the sensitivity of mammography
FINDINGS: There are no findings suspicious for malignancy. Images were
processed with CAD.
IMPRESSION: No mammographic evidence of malignancy. A result letter of this
screening mammogram will be mailed directly to the patient.

RECOMMENDATION:
Screening mammogram in one year. (Code:[id number])

BI-RADS CATEGORY  1: Negative.

## 2019-01-21 ENCOUNTER — Other Ambulatory Visit: Payer: Self-pay

## 2019-01-21 ENCOUNTER — Ambulatory Visit (INDEPENDENT_AMBULATORY_CARE_PROVIDER_SITE_OTHER): Payer: BC Managed Care – PPO | Admitting: Registered Nurse

## 2019-01-21 DIAGNOSIS — E559 Vitamin D deficiency, unspecified: Secondary | ICD-10-CM | POA: Diagnosis not present

## 2019-01-21 NOTE — Progress Notes (Signed)
Nurse visit for lab only 

## 2019-01-22 ENCOUNTER — Other Ambulatory Visit: Payer: Self-pay | Admitting: Registered Nurse

## 2019-01-22 ENCOUNTER — Encounter: Payer: Self-pay | Admitting: Registered Nurse

## 2019-01-22 DIAGNOSIS — E559 Vitamin D deficiency, unspecified: Secondary | ICD-10-CM

## 2019-01-22 LAB — VITAMIN D 25 HYDROXY (VIT D DEFICIENCY, FRACTURES): Vit D, 25-Hydroxy: 25.9 ng/mL — ABNORMAL LOW (ref 30.0–100.0)

## 2019-01-22 NOTE — Progress Notes (Signed)
This is a solid move in the right direction. We will switch to 2000 units of Vit D daily and recheck in around 4 months. Orders will be entered in pt chart. MyChart message has been sent. Hoping to see Ms Birkhead between Thanksgiving and Christmas for labs and OV for med check within 1 week after.   Kathrin Ruddy, NP

## 2019-01-22 NOTE — Progress Notes (Signed)
Repeat Vit D testing late November/early December, pt to schedule OV within a week after labs to discuss results and course of therapy.  Kathrin Ruddy, NP

## 2019-01-30 DIAGNOSIS — Z01419 Encounter for gynecological examination (general) (routine) without abnormal findings: Secondary | ICD-10-CM | POA: Diagnosis not present

## 2019-01-30 DIAGNOSIS — Z6835 Body mass index (BMI) 35.0-35.9, adult: Secondary | ICD-10-CM | POA: Diagnosis not present

## 2019-12-03 ENCOUNTER — Other Ambulatory Visit: Payer: Self-pay

## 2019-12-03 ENCOUNTER — Telehealth: Payer: Self-pay | Admitting: Registered Nurse

## 2019-12-03 DIAGNOSIS — E559 Vitamin D deficiency, unspecified: Secondary | ICD-10-CM

## 2019-12-03 DIAGNOSIS — Z1329 Encounter for screening for other suspected endocrine disorder: Secondary | ICD-10-CM

## 2019-12-03 NOTE — Telephone Encounter (Signed)
Labs placed.

## 2019-12-03 NOTE — Telephone Encounter (Signed)
Pt is having her cpe on 12/17/19 and her labs are scheduled for 12/12/19. Please put orders in for labs.

## 2019-12-11 ENCOUNTER — Other Ambulatory Visit: Payer: Self-pay | Admitting: Obstetrics and Gynecology

## 2019-12-11 DIAGNOSIS — Z1231 Encounter for screening mammogram for malignant neoplasm of breast: Secondary | ICD-10-CM

## 2019-12-12 ENCOUNTER — Other Ambulatory Visit: Payer: Self-pay

## 2019-12-12 ENCOUNTER — Ambulatory Visit: Payer: 59

## 2019-12-12 DIAGNOSIS — Z1329 Encounter for screening for other suspected endocrine disorder: Secondary | ICD-10-CM

## 2019-12-12 DIAGNOSIS — Z0000 Encounter for general adult medical examination without abnormal findings: Secondary | ICD-10-CM

## 2019-12-12 DIAGNOSIS — E559 Vitamin D deficiency, unspecified: Secondary | ICD-10-CM

## 2019-12-13 LAB — CMP14+EGFR
ALT: 12 IU/L (ref 0–32)
AST: 15 IU/L (ref 0–40)
Albumin/Globulin Ratio: 1.5 (ref 1.2–2.2)
Albumin: 4.4 g/dL (ref 3.8–4.8)
Alkaline Phosphatase: 66 IU/L (ref 48–121)
BUN/Creatinine Ratio: 13 (ref 9–23)
BUN: 12 mg/dL (ref 6–24)
Bilirubin Total: 0.3 mg/dL (ref 0.0–1.2)
CO2: 23 mmol/L (ref 20–29)
Calcium: 9.2 mg/dL (ref 8.7–10.2)
Chloride: 104 mmol/L (ref 96–106)
Creatinine, Ser: 0.95 mg/dL (ref 0.57–1.00)
GFR calc Af Amer: 81 mL/min/{1.73_m2} (ref >59)
GFR calc non Af Amer: 71 mL/min/{1.73_m2} (ref >59)
Globulin, Total: 3 g/dL (ref 1.5–4.5)
Glucose: 100 mg/dL — ABNORMAL HIGH (ref 65–99)
Potassium: 4.3 mmol/L (ref 3.5–5.2)
Sodium: 140 mmol/L (ref 134–144)
Total Protein: 7.4 g/dL (ref 6.0–8.5)

## 2019-12-13 LAB — LIPID PANEL
Chol/HDL Ratio: 3 ratio (ref 0.0–4.4)
Cholesterol, Total: 146 mg/dL (ref 100–199)
HDL: 49 mg/dL (ref >39)
LDL Chol Calc (NIH): 86 mg/dL (ref 0–99)
Triglycerides: 53 mg/dL (ref 0–149)
VLDL Cholesterol Cal: 11 mg/dL (ref 5–40)

## 2019-12-13 LAB — CBC WITH DIFFERENTIAL/PLATELET
Basophils Absolute: 0.1 10*3/uL (ref 0.0–0.2)
Basos: 1 %
EOS (ABSOLUTE): 0.1 10*3/uL (ref 0.0–0.4)
Eos: 1 %
Hematocrit: 39 % (ref 34.0–46.6)
Hemoglobin: 12.5 g/dL (ref 11.1–15.9)
Immature Grans (Abs): 0 10*3/uL (ref 0.0–0.1)
Immature Granulocytes: 0 %
Lymphocytes Absolute: 1.9 10*3/uL (ref 0.7–3.1)
Lymphs: 25 %
MCH: 28.7 pg (ref 26.6–33.0)
MCHC: 32.1 g/dL (ref 31.5–35.7)
MCV: 90 fL (ref 79–97)
Monocytes Absolute: 0.6 10*3/uL (ref 0.1–0.9)
Monocytes: 8 %
Neutrophils Absolute: 4.8 10*3/uL (ref 1.4–7.0)
Neutrophils: 65 %
Platelets: 233 10*3/uL (ref 150–450)
RBC: 4.35 x10E6/uL (ref 3.77–5.28)
RDW: 11.2 % — ABNORMAL LOW (ref 11.7–15.4)
WBC: 7.5 10*3/uL (ref 3.4–10.8)

## 2019-12-13 LAB — VITAMIN D 25 HYDROXY (VIT D DEFICIENCY, FRACTURES): Vit D, 25-Hydroxy: 32.2 ng/mL (ref 30.0–100.0)

## 2019-12-13 LAB — TSH: TSH: 0.985 u[IU]/mL (ref 0.450–4.500)

## 2019-12-17 ENCOUNTER — Encounter: Payer: Self-pay | Admitting: Registered Nurse

## 2019-12-17 ENCOUNTER — Ambulatory Visit (INDEPENDENT_AMBULATORY_CARE_PROVIDER_SITE_OTHER): Payer: 59 | Admitting: Registered Nurse

## 2019-12-17 ENCOUNTER — Other Ambulatory Visit: Payer: Self-pay

## 2019-12-17 VITALS — BP 136/82 | HR 91 | Temp 98.0°F | Resp 17 | Ht 64.0 in | Wt 211.8 lb

## 2019-12-17 DIAGNOSIS — E559 Vitamin D deficiency, unspecified: Secondary | ICD-10-CM | POA: Diagnosis not present

## 2019-12-17 DIAGNOSIS — Z0001 Encounter for general adult medical examination with abnormal findings: Secondary | ICD-10-CM

## 2019-12-17 DIAGNOSIS — Z1329 Encounter for screening for other suspected endocrine disorder: Secondary | ICD-10-CM | POA: Diagnosis not present

## 2019-12-17 DIAGNOSIS — Z0000 Encounter for general adult medical examination without abnormal findings: Secondary | ICD-10-CM
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
## 2019-12-17 NOTE — Patient Instructions (Signed)
° ° ° °  If you have lab work done today you will be contacted with your lab results within the next 2 weeks.  If you have not heard from us then please contact us. The fastest way to get your results is to register for My Chart. ° ° °IF you received an x-ray today, you will receive an invoice from [facility]. Please contact [facility] at [phone] with questions or concerns regarding your invoice.  ° °IF you received labwork today, you will receive an invoice from [facility]. Please contact [facility] at [phone] with questions or concerns regarding your invoice.  ° °Our billing staff will not be able to assist you with questions regarding bills from these companies. ° °You will be contacted with the lab results as soon as they are available. The fastest way to get your results is to activate your My Chart account. Instructions are located on the last page of this paperwork. If you have not heard from us regarding the results in 2 weeks, please contact this office. °  ° ° ° °

## 2020-01-01 ENCOUNTER — Ambulatory Visit: Payer: BC Managed Care – PPO | Admitting: Registered Nurse

## 2020-01-08 ENCOUNTER — Ambulatory Visit: Payer: Self-pay

## 2020-01-21 ENCOUNTER — Ambulatory Visit: Payer: Self-pay

## 2020-01-22 ENCOUNTER — Other Ambulatory Visit: Payer: Self-pay

## 2020-01-22 ENCOUNTER — Ambulatory Visit
Admission: RE | Admit: 2020-01-22 | Discharge: 2020-01-22 | Disposition: A | Discharge status: Home / Self Care | Payer: 59 | Source: Ambulatory Visit | Attending: Obstetrics and Gynecology | Admitting: Obstetrics and Gynecology

## 2020-01-22 DIAGNOSIS — Z1231 Encounter for screening mammogram for malignant neoplasm of breast: Secondary | ICD-10-CM

## 2020-01-22 IMAGING — MG DIGITAL SCREENING BILAT W/ TOMO W/ CAD
8 series · 8 of 24 positions shown · non-contrast
Comparison: Previous exam(s).

CLINICAL DATA: Screening.

EXAM:
DIGITAL SCREENING BILATERAL MAMMOGRAM WITH TOMO AND CAD

[L MLO synth-2D]
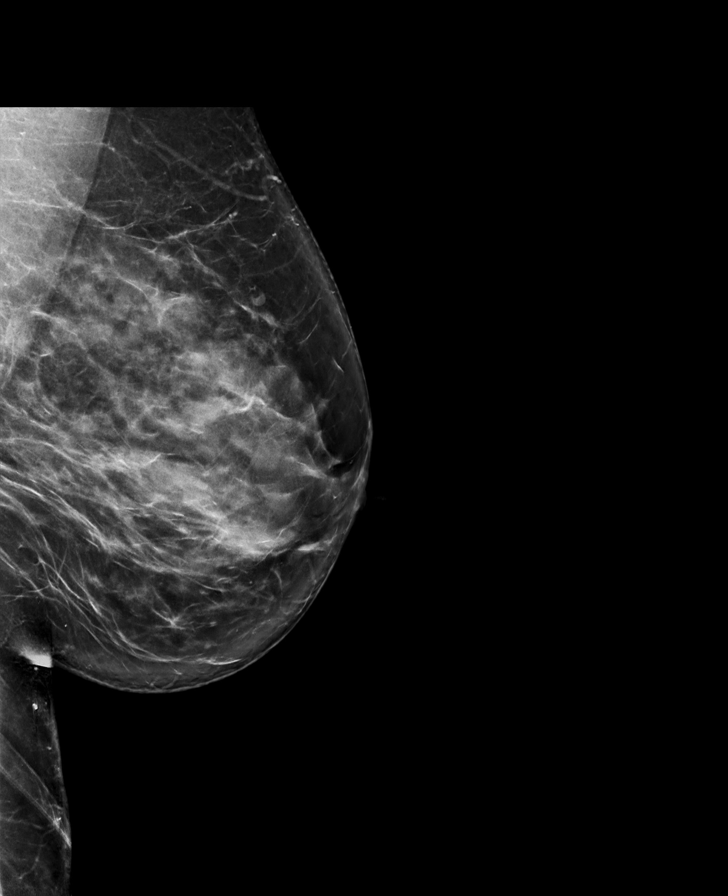

[R MLO synth-2D]
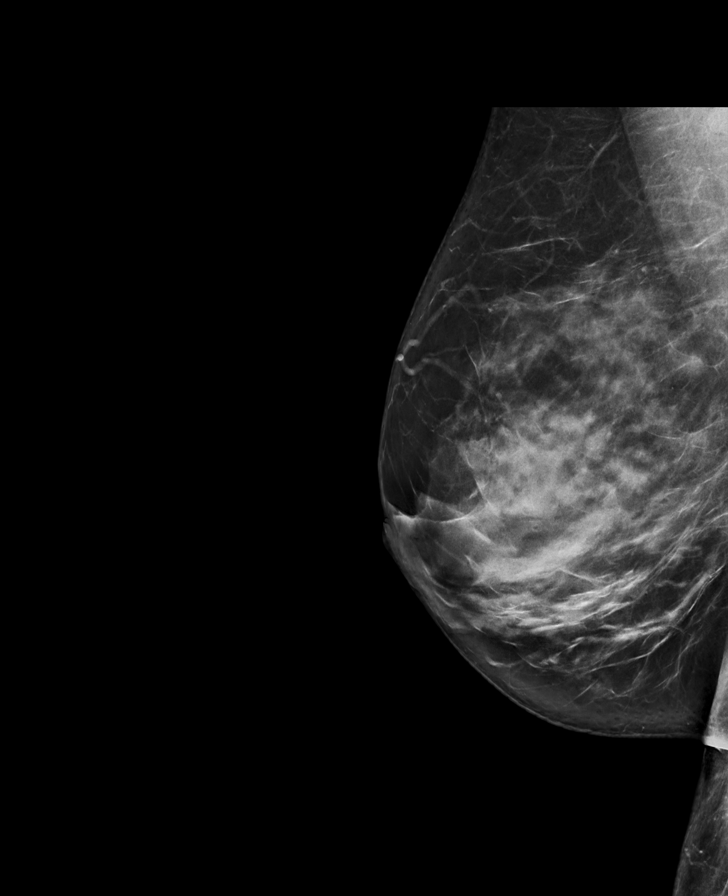

[R CC synth-2D]
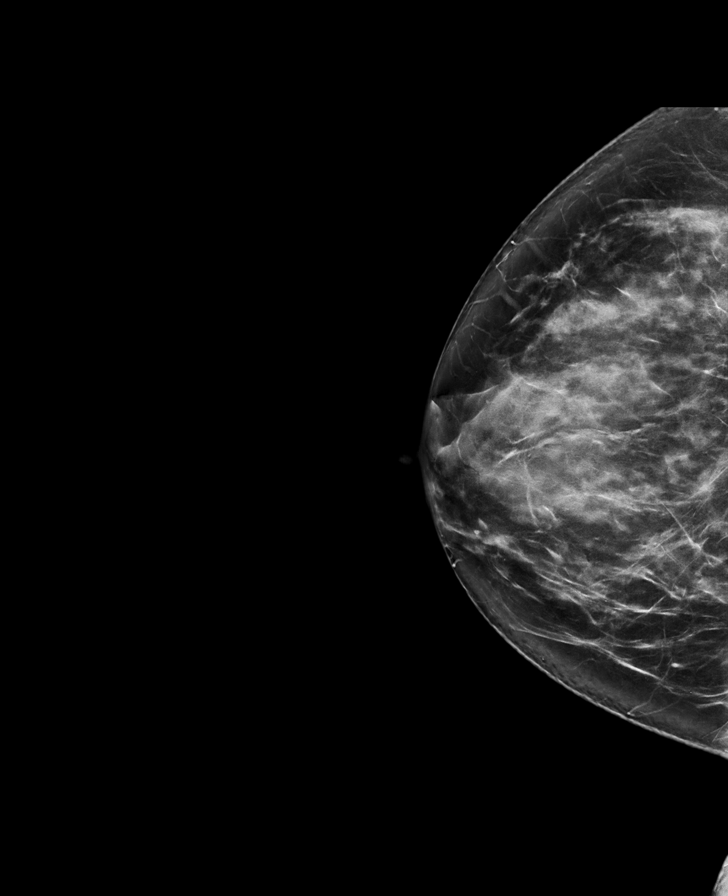

[L CC synth-2D]
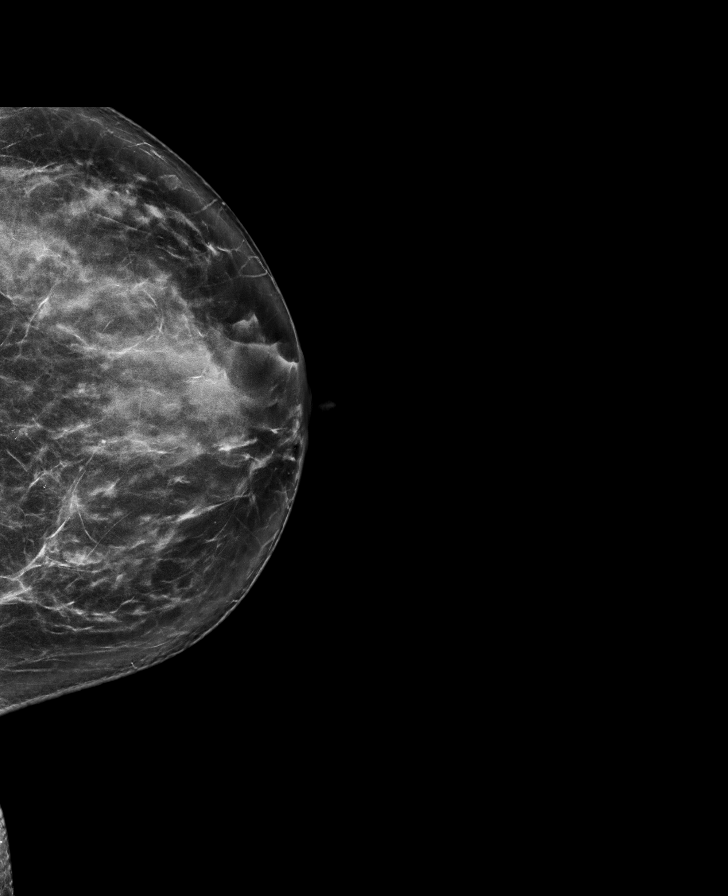

[L MLO tomo · tomo slice 47/92.0]
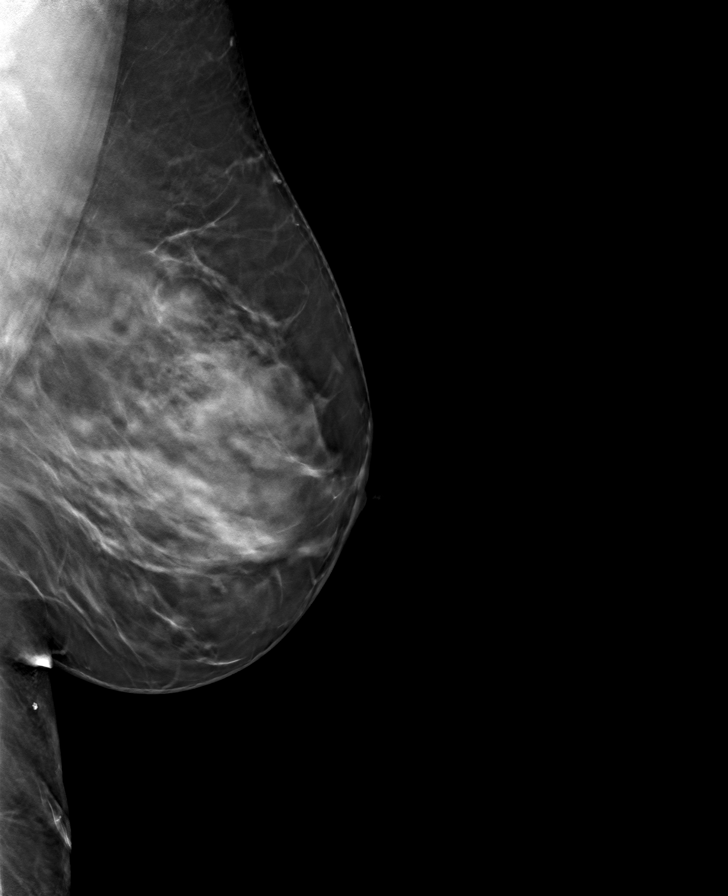

[L CC tomo · tomo slice 42/83.0]
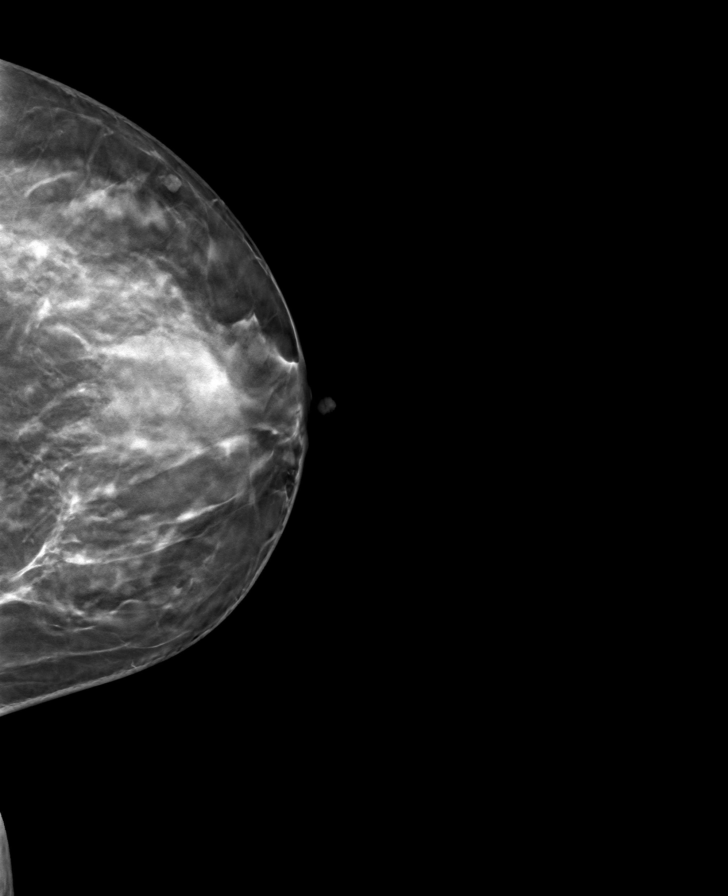

[R MLO tomo · tomo slice 45/90.0]
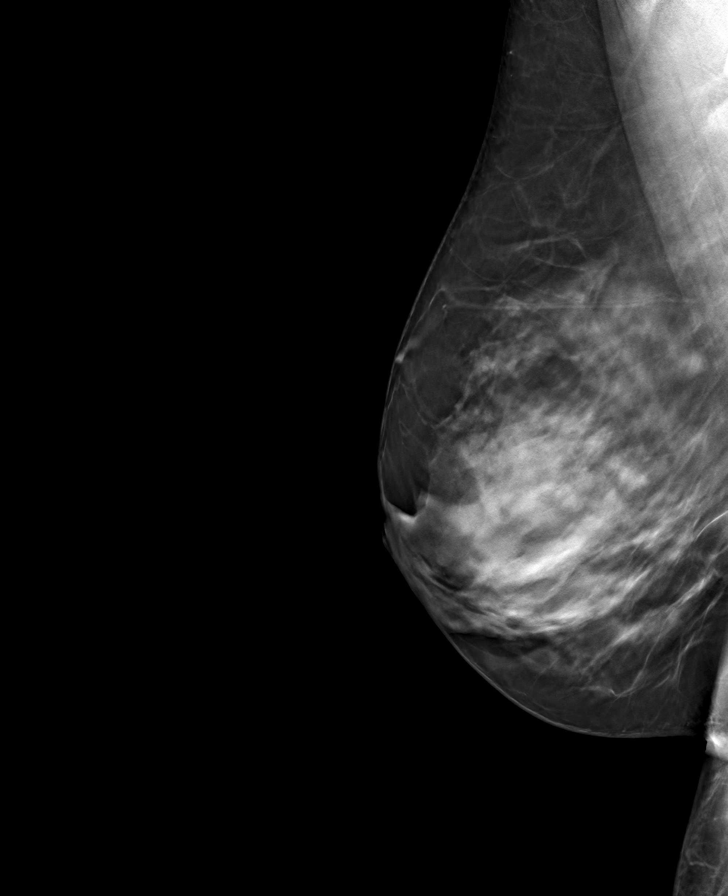

[R CC tomo · tomo slice 42/83.0]
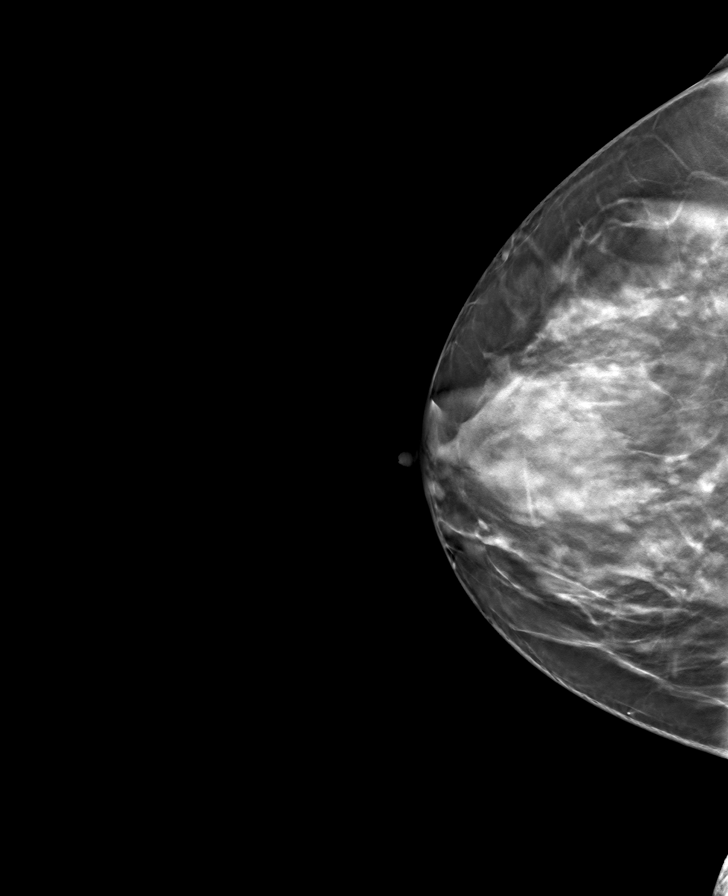

[8 of 24 positions shown; findings below may reference images not displayed]

ACR Breast Density Category d: The breast tissue is extremely dense,
which lowers the sensitivity of mammography
FINDINGS: There are no findings suspicious for malignancy. Images were
processed with CAD.
IMPRESSION: No mammographic evidence of malignancy. A result letter of this
screening mammogram will be mailed directly to the patient.

RECOMMENDATION:
Screening mammogram in one year. (Code:WO-[id number])

BI-RADS CATEGORY  1: Negative.

## 2020-11-12 ENCOUNTER — Other Ambulatory Visit: Payer: Self-pay | Admitting: Registered Nurse

## 2020-11-12 ENCOUNTER — Telehealth: Payer: Self-pay | Admitting: Registered Nurse

## 2020-11-12 DIAGNOSIS — Z13228 Encounter for screening for other metabolic disorders: Secondary | ICD-10-CM

## 2020-11-12 DIAGNOSIS — E559 Vitamin D deficiency, unspecified: Secondary | ICD-10-CM

## 2020-11-12 DIAGNOSIS — Z13220 Encounter for screening for lipoid disorders: Secondary | ICD-10-CM

## 2020-11-12 DIAGNOSIS — Z1329 Encounter for screening for other suspected endocrine disorder: Secondary | ICD-10-CM

## 2020-11-12 NOTE — Telephone Encounter (Signed)
Pt called in stating if she can come in a few days before her cpe appt on 12/21/20 for fasting labs?  Please advise and add orders if this is ok

## 2020-11-12 NOTE — Telephone Encounter (Signed)
Have placed future orders, she can come in any time within 3 weeks before her appt  Thank you  Rich

## 2020-11-12 NOTE — Telephone Encounter (Signed)
Called patient to inform. Lab appointment scheduled.

## 2020-12-07 ENCOUNTER — Other Ambulatory Visit (INDEPENDENT_AMBULATORY_CARE_PROVIDER_SITE_OTHER): Payer: 59

## 2020-12-07 ENCOUNTER — Other Ambulatory Visit: Payer: Self-pay

## 2020-12-07 DIAGNOSIS — Z13228 Encounter for screening for other metabolic disorders: Secondary | ICD-10-CM | POA: Diagnosis not present

## 2020-12-07 DIAGNOSIS — Z1329 Encounter for screening for other suspected endocrine disorder: Secondary | ICD-10-CM | POA: Diagnosis not present

## 2020-12-07 DIAGNOSIS — E559 Vitamin D deficiency, unspecified: Secondary | ICD-10-CM

## 2020-12-07 DIAGNOSIS — Z13220 Encounter for screening for lipoid disorders: Secondary | ICD-10-CM | POA: Diagnosis not present

## 2020-12-07 LAB — CBC WITH DIFFERENTIAL/PLATELET
Basophils Absolute: 0 10*3/uL (ref 0.0–0.1)
Basophils Relative: 0.7 % (ref 0.0–3.0)
Eosinophils Absolute: 0.1 10*3/uL (ref 0.0–0.7)
Eosinophils Relative: 1.2 % (ref 0.0–5.0)
HCT: 38.2 % (ref 36.0–46.0)
Hemoglobin: 12.5 g/dL (ref 12.0–15.0)
Lymphocytes Relative: 23.4 % (ref 12.0–46.0)
Lymphs Abs: 1.5 10*3/uL (ref 0.7–4.0)
MCHC: 32.7 g/dL (ref 30.0–36.0)
MCV: 89.5 fl (ref 78.0–100.0)
Monocytes Absolute: 0.5 10*3/uL (ref 0.1–1.0)
Monocytes Relative: 8.2 % (ref 3.0–12.0)
Neutro Abs: 4.1 10*3/uL (ref 1.4–7.7)
Neutrophils Relative %: 66.5 % (ref 43.0–77.0)
Platelets: 201 10*3/uL (ref 150.0–400.0)
RBC: 4.27 Mil/uL (ref 3.87–5.11)
RDW: 12.7 % (ref 11.5–15.5)
WBC: 6.2 10*3/uL (ref 4.0–10.5)

## 2020-12-07 LAB — HEMOGLOBIN A1C: Hgb A1c MFr Bld: 4.9 % (ref 4.6–6.5)

## 2020-12-07 LAB — TSH: TSH: 0.63 u[IU]/mL (ref 0.35–4.50)

## 2020-12-07 LAB — VITAMIN D 25 HYDROXY (VIT D DEFICIENCY, FRACTURES): VITD: 26.52 ng/mL — ABNORMAL LOW (ref 30.00–100.00)

## 2020-12-08 LAB — COMPREHENSIVE METABOLIC PANEL
ALT: 11 U/L (ref 0–35)
AST: 15 U/L (ref 0–37)
Albumin: 4.3 g/dL (ref 3.5–5.2)
Alkaline Phosphatase: 49 U/L (ref 39–117)
BUN: 12 mg/dL (ref 6–23)
CO2: 22 mEq/L (ref 19–32)
Calcium: 9.4 mg/dL (ref 8.4–10.5)
Chloride: 102 mEq/L (ref 96–112)
Creatinine, Ser: 0.99 mg/dL (ref 0.40–1.20)
GFR: 66.55 mL/min (ref >60.00)
Glucose, Bld: 92 mg/dL (ref 70–99)
Potassium: 4.1 mEq/L (ref 3.5–5.1)
Sodium: 137 mEq/L (ref 135–145)
Total Bilirubin: 0.7 mg/dL (ref 0.2–1.2)
Total Protein: 7.4 g/dL (ref 6.0–8.3)

## 2020-12-08 LAB — LIPID PANEL
Cholesterol: 146 mg/dL (ref 0–200)
HDL: 49.1 mg/dL (ref >39.00)
LDL Cholesterol: 84 mg/dL (ref 0–99)
NonHDL: 97.03
Total CHOL/HDL Ratio: 3
Triglycerides: 66 mg/dL (ref 0.0–149.0)
VLDL: 13.2 mg/dL (ref 0.0–40.0)

## 2020-12-21 ENCOUNTER — Encounter: Payer: Self-pay | Admitting: Registered Nurse

## 2020-12-21 ENCOUNTER — Other Ambulatory Visit: Payer: Self-pay

## 2020-12-21 ENCOUNTER — Ambulatory Visit (INDEPENDENT_AMBULATORY_CARE_PROVIDER_SITE_OTHER): Payer: 59 | Admitting: Registered Nurse

## 2020-12-21 VITALS — Temp 98.0°F | Resp 18 | Ht 64.0 in | Wt 208.0 lb

## 2020-12-21 DIAGNOSIS — E559 Vitamin D deficiency, unspecified: Secondary | ICD-10-CM | POA: Diagnosis not present

## 2020-12-21 DIAGNOSIS — Z1211 Encounter for screening for malignant neoplasm of colon: Secondary | ICD-10-CM

## 2020-12-21 DIAGNOSIS — Z0000 Encounter for general adult medical examination without abnormal findings: Secondary | ICD-10-CM

## 2020-12-21 DIAGNOSIS — Z23 Encounter for immunization: Secondary | ICD-10-CM

## 2020-12-21 NOTE — Progress Notes (Signed)
Established Patient Office Visit  Subjective:  Patient ID: Chelsea Mason, female    DOB: Dec 10, 1970  Age: 50 y.o. MRN: [id number]  CC:  Chief Complaint  Patient presents with   Annual Exam    Patient states he is here for an CPE.    HPI Miraya L Nazareno presents for CPE  No acute concerns  Presented for labs around 2 weeks ago: review shows that cbc, cmp, a1c, lipid panel, and tsh wnl. Vit D mildly low to 26. She continues OTC supplement.  Otherwise, due for colon ca screening. No fam hx, no symptoms. Mutual decision making - opt for cologuard.   Past Medical History:  Diagnosis Date   Allergy     Past Surgical History:  Procedure Laterality Date   BUNIONECTOMY     BUNIONECTOMY Left 12/21/2013   Dr. Craig Staggers [facility]al   BUNIONECTOMY Right 06/17/2014   Dr. Ila Mcgill   CESAREAN SECTION     1) 11/2003  2)02/2005    Family History  Problem Relation Age of Onset   Hypertension Mother    Heart disease Maternal Grandfather    Stroke Maternal Grandfather    Diabetes Paternal Grandmother     Social History   Socioeconomic History   Marital status: Divorced    Spouse name: Not on file   Number of children: Not on file   Years of education: Not on file   Highest education level: Not on file  Occupational History   Occupation: Risk analyst    Employer: [employer]  Tobacco Use   Smoking status: Never   Smokeless tobacco: Never  Vaping Use   Vaping Use: Never used  Substance and Sexual Activity   Alcohol use: No    Alcohol/week: 0.0 standard drinks   Drug use: No   Sexual activity: Not Currently  Other Topics Concern   Not on file  Social History Narrative   Not on file   Social Determinants of Health   Financial Resource Strain: Not on file  Food Insecurity: Not on file  Transportation Needs: Not on file  Physical Activity: Not on file  Stress: Not on file  Social Connections: Not on file  Intimate Partner Violence: Not on file    Outpatient  Medications Prior to Visit  Medication Sig Dispense Refill   cetirizine (ZYRTEC) 10 MG tablet Take 10 mg by mouth daily.     ibuprofen (ADVIL,MOTRIN) 200 MG tablet Take 200 mg by mouth.     Vitamin D, Ergocalciferol, (DRISDOL) 1.25 MG (50000 UT) CAPS capsule Take 1 capsule (50,000 Units total) by mouth every 7 (seven) days. (Patient not taking: Reported on 12/17/2019) 6 capsule 0   No facility-administered medications prior to visit.    No Known Allergies  ROS Review of Systems  Constitutional: Negative.   HENT: Negative.    Eyes: Negative.   Respiratory: Negative.    Cardiovascular: Negative.   Gastrointestinal: Negative.   Genitourinary: Negative.   Musculoskeletal: Negative.   Skin: Negative.   Neurological: Negative.   Psychiatric/Behavioral: Negative.    All other systems reviewed and are negative.    Objective:    Physical Exam Vitals and nursing note reviewed.  Constitutional:      General: She is not in acute distress.    Appearance: Normal appearance. She is normal weight. She is not ill-appearing, toxic-appearing or diaphoretic.  HENT:     Head: Normocephalic and atraumatic.     Right Ear: Tympanic membrane, ear canal and external  ear normal. There is no impacted cerumen.     Left Ear: Tympanic membrane, ear canal and external ear normal. There is no impacted cerumen.     Nose: Nose normal. No congestion or rhinorrhea.     Mouth/Throat:     Mouth: Mucous membranes are moist.     Pharynx: Oropharynx is clear. No oropharyngeal exudate or posterior oropharyngeal erythema.  Eyes:     General: No scleral icterus.       Right eye: No discharge.        Left eye: No discharge.     Extraocular Movements: Extraocular movements intact.     Conjunctiva/sclera: Conjunctivae normal.     Pupils: Pupils are equal, round, and reactive to light.  Cardiovascular:     Rate and Rhythm: Normal rate and regular rhythm.     Pulses: Normal pulses.     Heart sounds: Normal heart  sounds. No murmur heard.   No friction rub. No gallop.  Pulmonary:     Effort: Pulmonary effort is normal. No respiratory distress.     Breath sounds: Normal breath sounds. No stridor. No wheezing, rhonchi or rales.  Chest:     Chest wall: No tenderness.  Abdominal:     General: Abdomen is flat. Bowel sounds are normal. There is no distension.     Palpations: Abdomen is soft. There is no mass.     Tenderness: There is no abdominal tenderness. There is no right CVA tenderness, left CVA tenderness, guarding or rebound.     Hernia: No hernia is present.  Musculoskeletal:        General: No swelling, tenderness, deformity or signs of injury. Normal range of motion.     Right lower leg: No edema.     Left lower leg: No edema.  Skin:    General: Skin is warm and dry.     Capillary Refill: Capillary refill takes less than 2 seconds.     Coloration: Skin is not jaundiced or pale.     Findings: No bruising, erythema, lesion or rash.  Neurological:     General: No focal deficit present.     Mental Status: She is alert and oriented to person, place, and time. Mental status is at baseline.     Cranial Nerves: No cranial nerve deficit.     Sensory: No sensory deficit.     Motor: No weakness.     Coordination: Coordination normal.     Gait: Gait normal.     Deep Tendon Reflexes: Reflexes normal.  Psychiatric:        Mood and Affect: Mood normal.        Behavior: Behavior normal.        Thought Content: Thought content normal.        Judgment: Judgment normal.    Temp 98 F (36.7 C) (Temporal)   Resp 18   Ht 5\' 4"  (1.626 m)   Wt 208 lb (94.3 kg)   SpO2 99%   BMI 35.70 kg/m  Wt Readings from Last 3 Encounters:  12/21/20 208 lb (94.3 kg)  12/17/19 211 lb 12.8 oz (96.1 kg)  12/31/18 209 lb (94.8 kg)     Health Maintenance Due  Topic Date Due   COLONOSCOPY (Pts 45-44yrs Insurance coverage will need to be confirmed)  Never done    There are no preventive care reminders to display  for this patient.  Lab Results  Component Value Date   TSH 0.63 12/07/2020   Lab Results  Component Value Date   WBC 6.2 12/07/2020   HGB 12.5 12/07/2020   HCT 38.2 12/07/2020   MCV 89.5 12/07/2020   PLT 201.0 12/07/2020   Lab Results  Component Value Date   NA 137 12/07/2020   K 4.1 12/07/2020   CO2 22 12/07/2020   GLUCOSE 92 12/07/2020   BUN 12 12/07/2020   CREATININE 0.99 12/07/2020   BILITOT 0.7 12/07/2020   ALKPHOS 49 12/07/2020   AST 15 12/07/2020   ALT 11 12/07/2020   PROT 7.4 12/07/2020   ALBUMIN 4.3 12/07/2020   CALCIUM 9.4 12/07/2020   GFR 66.55 12/07/2020   Lab Results  Component Value Date   CHOL 146 12/07/2020   Lab Results  Component Value Date   HDL 49.10 12/07/2020   Lab Results  Component Value Date   LDLCALC 84 12/07/2020   Lab Results  Component Value Date   TRIG 66.0 12/07/2020   Lab Results  Component Value Date   CHOLHDL 3 12/07/2020   Lab Results  Component Value Date   HGBA1C 4.9 12/07/2020      Assessment & Plan:   Problem List Items Addressed This Visit       Other   Vitamin D deficiency   Other Visit Diagnoses     Annual physical exam    -  Primary   Screen for colon cancer       Relevant Orders   Cologuard       No orders of the defined types were placed in this encounter.   Follow-up: Return in about 1 year (around 12/21/2021) for CPE and labs.   PLAN Exam unremarkable Labs reviewed Cologuard sent Return annually Patient encouraged to call clinic with any questions, comments, or concerns.  Maximiano Coss, NP

## 2020-12-21 NOTE — Patient Instructions (Addendum)
Chelsea Mason -  Still healthy! Keep up the good work  Labs show no concerns beyond mildly low Vitamin D. Stay active and keep taking an OTC supplement to maintain bone density and vitamin d levels.  See you in a year, sooner if concerns arise.  Thank you  Rich     If you have lab work done today you will be contacted with your lab results within the next 2 weeks.  If you have not heard from [place] then please contact us. The fastest way to get your results is to register for My Chart.   IF you received an x-ray today, you will receive an invoice from [facility]. Please contact [facility] at [phone] with questions or concerns regarding your invoice.   IF you received labwork today, you will receive an invoice from [facility]. Please contact [facility] at [phone] with questions or concerns regarding your invoice.   Our billing staff will not be able to assist you with questions regarding bills from these companies.  You will be contacted with the lab results as soon as they are available. The fastest way to get your results is to activate your My Chart account. Instructions are located on the last page of this paperwork. If you have not heard from [place] regarding the results in 2 weeks, please contact this office.

## 2020-12-24 ENCOUNTER — Other Ambulatory Visit: Payer: Self-pay | Admitting: Psychiatry

## 2020-12-24 DIAGNOSIS — Z1231 Encounter for screening mammogram for malignant neoplasm of breast: Secondary | ICD-10-CM

## 2021-02-16 ENCOUNTER — Other Ambulatory Visit: Payer: Self-pay

## 2021-02-16 ENCOUNTER — Other Ambulatory Visit: Payer: Self-pay | Admitting: Obstetrics and Gynecology

## 2021-02-16 ENCOUNTER — Ambulatory Visit
Admission: RE | Admit: 2021-02-16 | Discharge: 2021-02-16 | Disposition: A | Discharge status: Home / Self Care | Payer: 59 | Source: Ambulatory Visit

## 2021-02-16 DIAGNOSIS — Z1231 Encounter for screening mammogram for malignant neoplasm of breast: Secondary | ICD-10-CM

## 2021-02-16 IMAGING — MG MM DIGITAL SCREENING BILAT W/ TOMO AND CAD
8 series · 8 of 24 positions shown · non-contrast
Comparison: Previous exam(s).

CLINICAL DATA: Screening.

EXAM:
DIGITAL SCREENING BILATERAL MAMMOGRAM WITH TOMOSYNTHESIS AND CAD
TECHNIQUE: Bilateral screening digital craniocaudal and mediolateral oblique
mammograms were obtained. Bilateral screening digital breast
tomosynthesis was performed. The images were evaluated with
computer-aided detection.

[L MLO synth-2D]
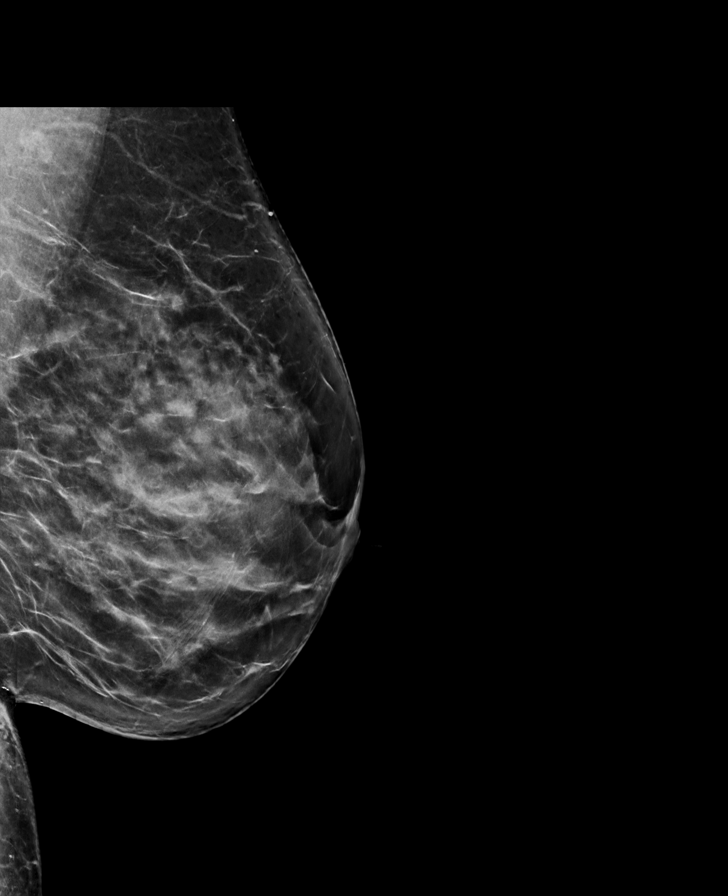

[R MLO synth-2D]
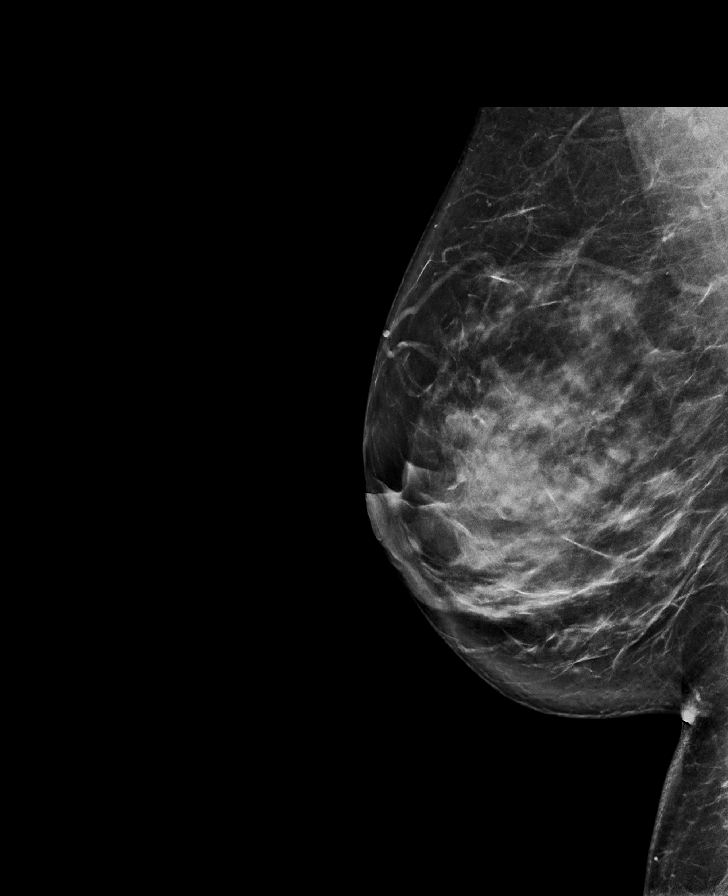

[L CC synth-2D]
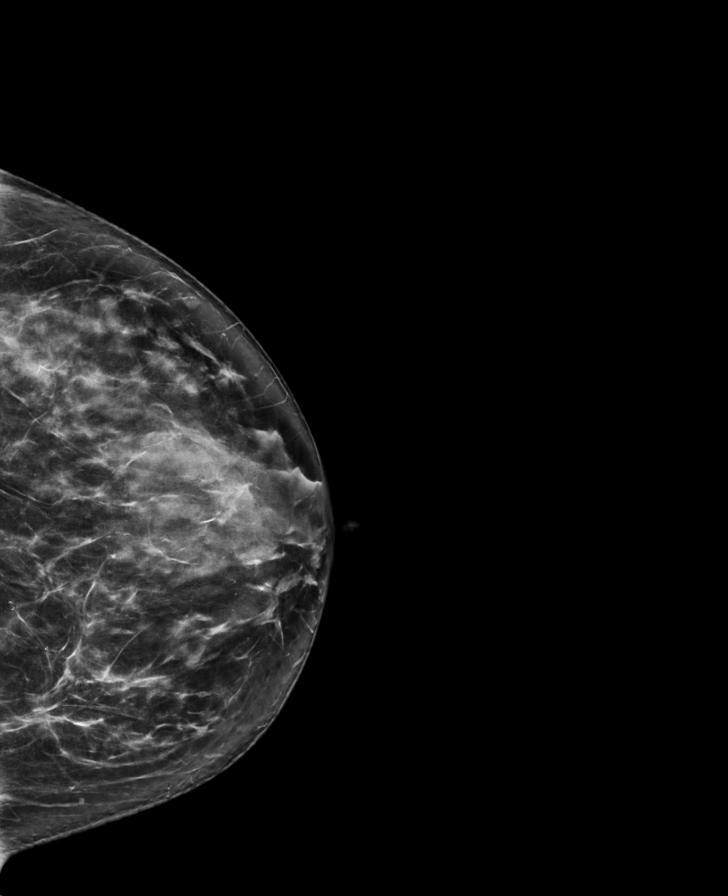

[R CC synth-2D]
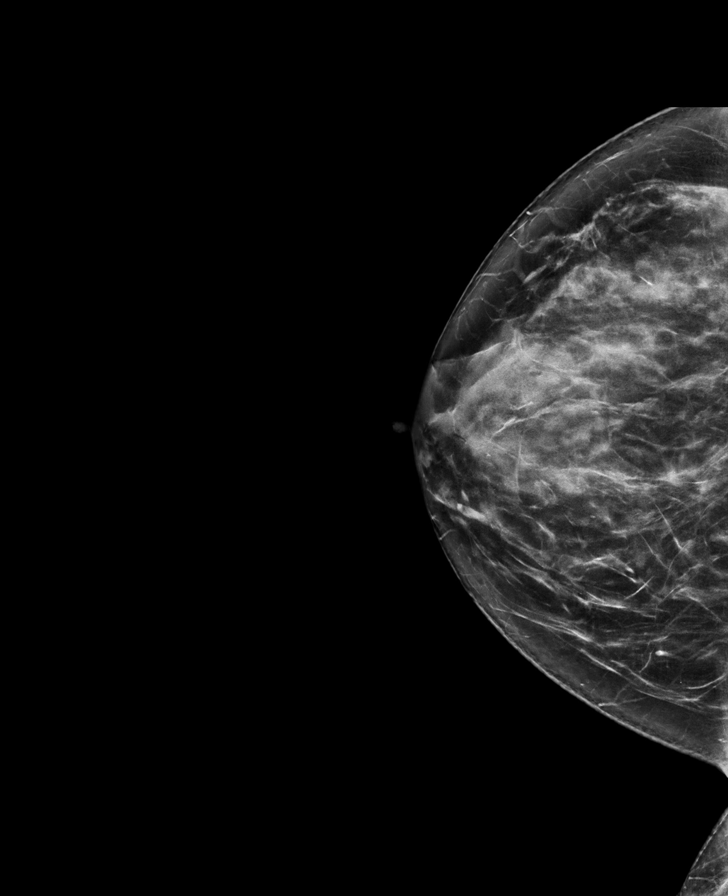

[R CC tomo · tomo slice 41/80.0]
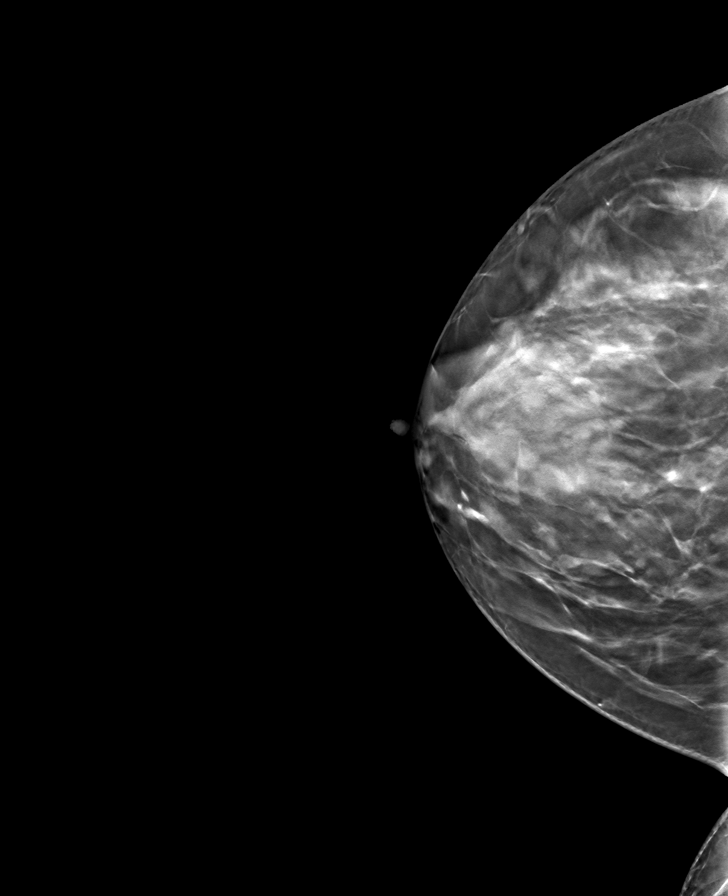

[L CC tomo · tomo slice 41/80.0]
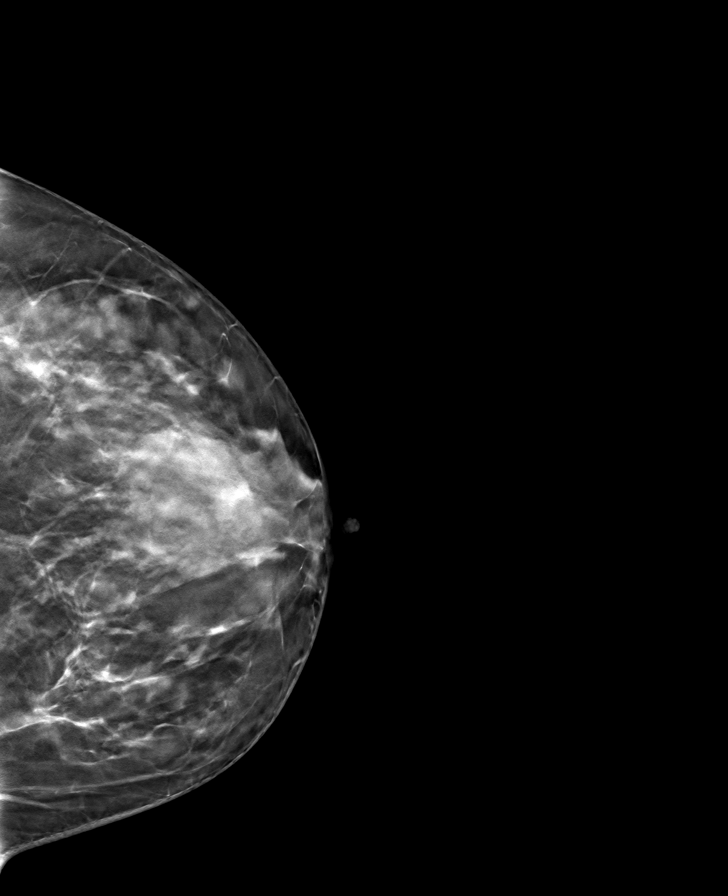

[L MLO tomo · tomo slice 45/89.0]
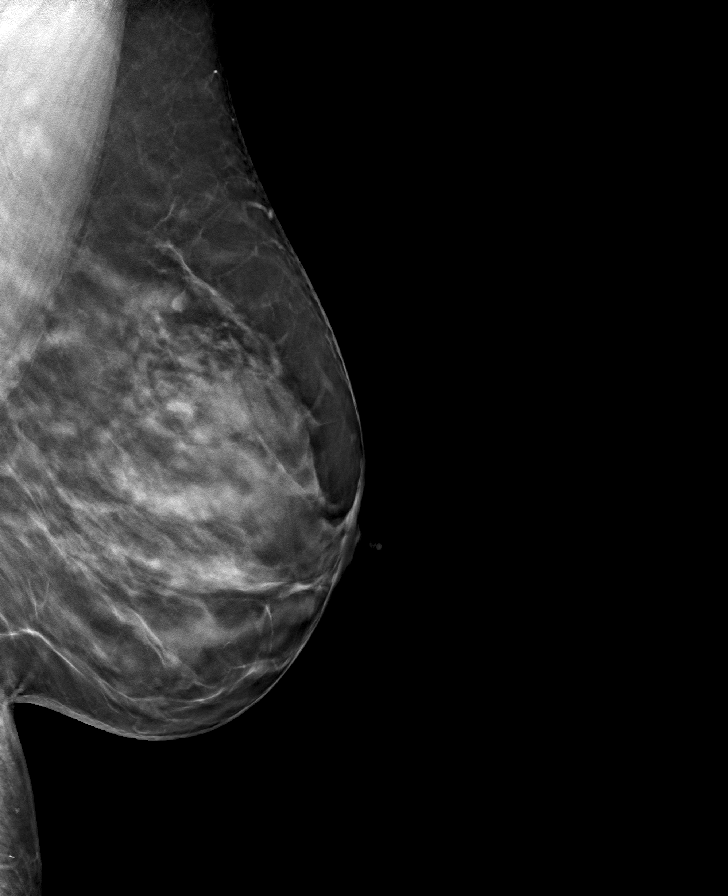

[R MLO tomo · tomo slice 45/90.0]
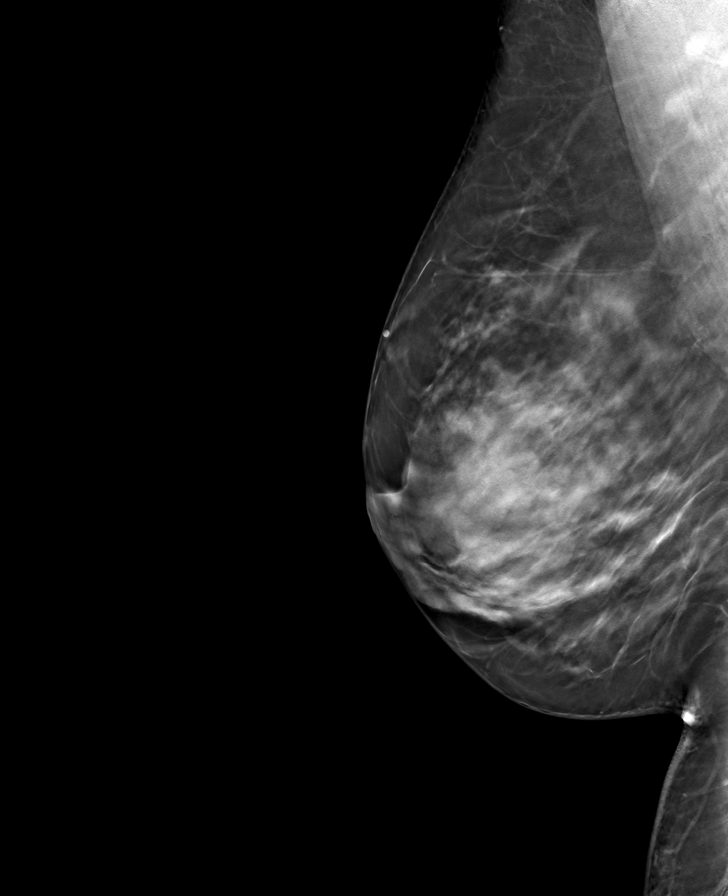

[8 of 24 positions shown; findings below may reference images not displayed]

ACR Breast Density Category c: The breast tissue is heterogeneously
dense, which may obscure small masses.
FINDINGS: There are no findings suspicious for malignancy.
IMPRESSION: No mammographic evidence of malignancy. A result letter of this
screening mammogram will be mailed directly to the patient.

RECOMMENDATION:
Screening mammogram in one year. (Code:[id number])

BI-RADS CATEGORY  1: Negative.

## 2021-03-22 ENCOUNTER — Other Ambulatory Visit: Payer: Self-pay

## 2021-03-22 ENCOUNTER — Ambulatory Visit (INDEPENDENT_AMBULATORY_CARE_PROVIDER_SITE_OTHER): Payer: 59 | Admitting: Registered Nurse

## 2021-03-22 DIAGNOSIS — Z23 Encounter for immunization: Secondary | ICD-10-CM | POA: Diagnosis not present

## 2021-12-17 ENCOUNTER — Other Ambulatory Visit: Payer: 59

## 2021-12-23 ENCOUNTER — Encounter: Payer: 59 | Admitting: Registered Nurse

## 2021-12-24 ENCOUNTER — Encounter: Payer: 59 | Admitting: Registered Nurse

## 2021-12-27 ENCOUNTER — Other Ambulatory Visit: Payer: Self-pay | Admitting: Registered Nurse

## 2021-12-27 ENCOUNTER — Telehealth: Payer: Self-pay | Admitting: Registered Nurse

## 2021-12-27 DIAGNOSIS — E559 Vitamin D deficiency, unspecified: Secondary | ICD-10-CM

## 2021-12-27 DIAGNOSIS — Z13220 Encounter for screening for lipoid disorders: Secondary | ICD-10-CM

## 2021-12-27 DIAGNOSIS — Z1329 Encounter for screening for other suspected endocrine disorder: Secondary | ICD-10-CM

## 2021-12-27 DIAGNOSIS — Z13228 Encounter for screening for other metabolic disorders: Secondary | ICD-10-CM

## 2021-12-29 ENCOUNTER — Encounter: Payer: Self-pay | Admitting: Registered Nurse

## 2021-12-29 ENCOUNTER — Other Ambulatory Visit (INDEPENDENT_AMBULATORY_CARE_PROVIDER_SITE_OTHER): Payer: 59

## 2021-12-29 ENCOUNTER — Ambulatory Visit (INDEPENDENT_AMBULATORY_CARE_PROVIDER_SITE_OTHER): Payer: 59 | Admitting: Registered Nurse

## 2021-12-29 ENCOUNTER — Other Ambulatory Visit: Payer: Self-pay

## 2021-12-29 VITALS — BP 132/78 | HR 99 | Temp 98.1°F | Resp 18 | Ht 64.5 in | Wt 210.8 lb

## 2021-12-29 DIAGNOSIS — E559 Vitamin D deficiency, unspecified: Secondary | ICD-10-CM

## 2021-12-29 DIAGNOSIS — Z13220 Encounter for screening for lipoid disorders: Secondary | ICD-10-CM

## 2021-12-29 DIAGNOSIS — Z1211 Encounter for screening for malignant neoplasm of colon: Secondary | ICD-10-CM | POA: Diagnosis not present

## 2021-12-29 DIAGNOSIS — Z13228 Encounter for screening for other metabolic disorders: Secondary | ICD-10-CM | POA: Diagnosis not present

## 2021-12-29 DIAGNOSIS — Z1329 Encounter for screening for other suspected endocrine disorder: Secondary | ICD-10-CM | POA: Diagnosis not present

## 2021-12-29 LAB — CBC WITH DIFFERENTIAL/PLATELET
Basophils Absolute: 0.1 10*3/uL (ref 0.0–0.1)
Basophils Relative: 0.7 % (ref 0.0–3.0)
Eosinophils Absolute: 0.2 10*3/uL (ref 0.0–0.7)
Eosinophils Relative: 1.9 % (ref 0.0–5.0)
HCT: 38.1 % (ref 36.0–46.0)
Hemoglobin: 12.2 g/dL (ref 12.0–15.0)
Lymphocytes Relative: 22.4 % (ref 12.0–46.0)
Lymphs Abs: 1.8 10*3/uL (ref 0.7–4.0)
MCHC: 32 g/dL (ref 30.0–36.0)
MCV: 89.3 fl (ref 78.0–100.0)
Monocytes Absolute: 0.6 10*3/uL (ref 0.1–1.0)
Monocytes Relative: 7.9 % (ref 3.0–12.0)
Neutro Abs: 5.5 10*3/uL (ref 1.4–7.7)
Neutrophils Relative %: 67.1 % (ref 43.0–77.0)
Platelets: 225 10*3/uL (ref 150.0–400.0)
RBC: 4.26 Mil/uL (ref 3.87–5.11)
RDW: 12.7 % (ref 11.5–15.5)
WBC: 8.1 10*3/uL (ref 4.0–10.5)

## 2021-12-29 LAB — LIPID PANEL
Cholesterol: 141 mg/dL (ref 0–200)
HDL: 50 mg/dL (ref >39.00)
LDL Cholesterol: 77 mg/dL (ref 0–99)
NonHDL: 91.01
Total CHOL/HDL Ratio: 3
Triglycerides: 68 mg/dL (ref 0.0–149.0)
VLDL: 13.6 mg/dL (ref 0.0–40.0)

## 2021-12-29 LAB — COMPREHENSIVE METABOLIC PANEL
ALT: 16 U/L (ref 0–35)
AST: 15 U/L (ref 0–37)
Albumin: 4.1 g/dL (ref 3.5–5.2)
Alkaline Phosphatase: 50 U/L (ref 39–117)
BUN: 12 mg/dL (ref 6–23)
CO2: 28 mEq/L (ref 19–32)
Calcium: 9.1 mg/dL (ref 8.4–10.5)
Chloride: 103 mEq/L (ref 96–112)
Creatinine, Ser: 1 mg/dL (ref 0.40–1.20)
GFR: 65.27 mL/min (ref >60.00)
Glucose, Bld: 104 mg/dL — ABNORMAL HIGH (ref 70–99)
Potassium: 4 mEq/L (ref 3.5–5.1)
Sodium: 137 mEq/L (ref 135–145)
Total Bilirubin: 0.5 mg/dL (ref 0.2–1.2)
Total Protein: 7.5 g/dL (ref 6.0–8.3)

## 2021-12-29 LAB — TSH: TSH: 1.4 u[IU]/mL (ref 0.35–5.50)

## 2021-12-29 LAB — VITAMIN D 25 HYDROXY (VIT D DEFICIENCY, FRACTURES): VITD: 41 ng/mL (ref 30.00–100.00)

## 2021-12-29 LAB — HEMOGLOBIN A1C: Hgb A1c MFr Bld: 4.8 % (ref 4.6–6.5)

## 2021-12-29 NOTE — Progress Notes (Signed)
Complete physical exam  Patient: Chelsea Mason   DOB: 07-10-1970   51 y.o. Female  MRN: [id number] Visit Date: 12/29/2021  Subjective:   No chief complaint on file.   Chelsea Mason is a 51 y.o. female who presents today for a complete physical exam. She reports consuming a general diet. The patient does not participate in regular exercise at present. She generally feels well. She reports sleeping well. She does not have additional problems to discuss today.   Vision:Within the last year Dental:Within Last 6 months STD Screen:No PSA:No  HM Due for colon ca screen - sent cologuard last year, has not yet completed.  Due for pap smear per chart - had seen Donnel Saxon, CNM, at [facility]. Will request records. Advise pt to follow up   Most recent fall risk assessment:    12/29/2021    2:36 PM  [place] in the past year? 0  Number falls in past yr: 0  Injury with Fall? 0  Risk for fall due to : No Fall Risks  Follow up Falls evaluation completed     Most recent depression screenings:    12/29/2021    2:36 PM 12/17/2019   12:59 PM  PHQ 2/9 Scores  PHQ - 2 Score 0 0  PHQ- 9 Score 0      Patient Active Problem List   Diagnosis Date Noted   Bilateral bunions 10/23/2013   Allergic rhinitis, cause unspecified 10/23/2013   Vitamin D deficiency 10/02/2012   Health care maintenance 10/02/2012   Past Medical History:  Diagnosis Date   Allergy    Past Surgical History:  Procedure Laterality Date   BUNIONECTOMY     BUNIONECTOMY Left 12/21/2013   Dr. Craig Staggers Regal   BUNIONECTOMY Right 06/17/2014   Dr. Ila Mcgill   CESAREAN SECTION     1) 11/2003  2)02/2005   Social History   Tobacco Use   Smoking status: Never   Smokeless tobacco: Never  Vaping Use   Vaping Use: Never used  Substance Use Topics   Alcohol use: No    Alcohol/week: 0.0 standard drinks of alcohol   Drug use: No   Social History   Socioeconomic History   Marital status: Divorced     Spouse name: Not on file   Number of children: Not on file   Years of education: Not on file   Highest education level: Not on file  Occupational History   Occupation: Risk analyst    Employer: [employer]  Tobacco Use   Smoking status: Never   Smokeless tobacco: Never  Vaping Use   Vaping Use: Never used  Substance and Sexual Activity   Alcohol use: No    Alcohol/week: 0.0 standard drinks of alcohol   Drug use: No   Sexual activity: Not Currently  Other Topics Concern   Not on file  Social History Narrative   Not on file   Social Determinants of Health   Financial Resource Strain: Low Risk  (11/20/2018)   Overall Financial Resource Strain (CARDIA)    Difficulty of Paying Living Expenses: Not hard at all  Food Insecurity: No Food Insecurity (11/20/2018)   Hunger Vital Sign    Worried About Running Out of Food in the Last Year: Never true    [place] in the Last Year: Never true  Transportation Needs: No Transportation Needs (11/20/2018)   PRAPARE - Hydrologist (Medical):  No    Lack of Transportation (Non-Medical): No  Physical Activity: Inactive (11/20/2018)   Exercise Vital Sign    Days of Exercise per Week: 0 days    Minutes of Exercise per Session: 0 min  Stress: Stress Concern Present (11/20/2018)   [place]    Feeling of Stress : To some extent  Social Connections: Somewhat Isolated (11/20/2018)   Social Connection and Isolation Panel [NHANES]    Frequency of Communication with Friends and Family: More than three times a week    Frequency of Social Gatherings with Friends and Family: Three times a week    Attends Religious Services: More than 4 times per year    Active Member of Clubs or Organizations: No    Attends Archivist Meetings: Never    Marital Status: Divorced  Human resources officer Violence: Not At Risk (11/20/2018)   Humiliation,  Afraid, Rape, and Kick questionnaire    Fear of Current or Ex-Partner: No    Emotionally Abused: No    Physically Abused: No    Sexually Abused: No   Family Status  Relation Name Status   Mother  Alive   MGF  Deceased   [place]  Alive   Father  Alive   Sister  Alive   Brother  Alive   Daughter  Alive   Son  Alive   MGM  Alive   PGF  Deceased   Family History  Problem Relation Age of Onset   Hypertension Mother    Heart disease Maternal Grandfather    Stroke Maternal Grandfather    Diabetes Paternal Grandmother    No Known Allergies   Patient Care Team: Maximiano Coss, NP as PCP - General (Adult Health Nurse Practitioner) Paulla Dolly Tamala Fothergill, DPM as Consulting Physician (Podiatry) Donnel Saxon, CNM as Midwife (Obstetrics and Gynecology)   Medications: Outpatient Medications Prior to Visit  Medication Sig   cetirizine (ZYRTEC) 10 MG tablet Take 10 mg by mouth daily.   ibuprofen (ADVIL,MOTRIN) 200 MG tablet Take 200 mg by mouth.   loratadine (CLARITIN) 10 MG tablet Take 1 tablet every day by oral route as needed.   No facility-administered medications prior to visit.    Review of Systems  Constitutional: Negative.   HENT: Negative.    Eyes: Negative.   Respiratory: Negative.    Cardiovascular: Negative.   Gastrointestinal: Negative.   Endocrine: Negative.   Genitourinary: Negative.   Musculoskeletal: Negative.   Skin: Negative.   Allergic/Immunologic: Negative.   Neurological: Negative.   Hematological: Negative.   Psychiatric/Behavioral: Negative.    All other systems reviewed and are negative.   Last CBC Lab Results  Component Value Date   WBC 8.1 12/29/2021   HGB 12.2 12/29/2021   HCT 38.1 12/29/2021   MCV 89.3 12/29/2021   MCH 28.7 12/12/2019   RDW 12.7 12/29/2021   PLT 225.0 66/59/9357   Last metabolic panel Lab Results  Component Value Date   GLUCOSE 104 (H) 12/29/2021   NA 137 12/29/2021   K 4.0 12/29/2021   CL 103 12/29/2021   CO2 28  12/29/2021   BUN 12 12/29/2021   CREATININE 1.00 12/29/2021   GFRNONAA 71 12/12/2019   CALCIUM 9.1 12/29/2021   PROT 7.5 12/29/2021   ALBUMIN 4.1 12/29/2021   LABGLOB 3.0 12/12/2019   AGRATIO 1.5 12/12/2019   BILITOT 0.5 12/29/2021   ALKPHOS 50 12/29/2021   AST 15 12/29/2021   ALT 16 12/29/2021  Last lipids Lab Results  Component Value Date   CHOL 141 12/29/2021   HDL 50.00 12/29/2021   LDLCALC 77 12/29/2021   TRIG 68.0 12/29/2021   CHOLHDL 3 12/29/2021   Last hemoglobin A1c Lab Results  Component Value Date   HGBA1C 4.8 12/29/2021   Last thyroid functions Lab Results  Component Value Date   TSH 1.40 12/29/2021   T4TOTAL 7.1 11/28/2014   Last vitamin D Lab Results  Component Value Date   VD25OH 41.00 12/29/2021   Last vitamin B12 and Folate No results found for: "VITAMINB12", "FOLATE"      Objective:     BP 132/78   Pulse 99   Temp 98.1 F (36.7 C) (Temporal)   Resp 18   Ht 5' 4.5" (1.638 m)   Wt 210 lb 12.8 oz (95.6 kg)   SpO2 99%   BMI 35.62 kg/m   BP Readings from Last 3 Encounters:  12/29/21 132/78  12/17/19 136/82  12/31/18 (!) 144/88   Wt Readings from Last 3 Encounters:  12/29/21 210 lb 12.8 oz (95.6 kg)  12/21/20 208 lb (94.3 kg)  12/17/19 211 lb 12.8 oz (96.1 kg)   SpO2 Readings from Last 3 Encounters:  12/29/21 99%  12/21/20 99%  12/17/19 100%      Physical Exam Vitals and nursing note reviewed.  Constitutional:      General: She is not in acute distress.    Appearance: Normal appearance. She is obese. She is not ill-appearing, toxic-appearing or diaphoretic.  HENT:     Head: Normocephalic and atraumatic.     Right Ear: Tympanic membrane, ear canal and external ear normal. There is no impacted cerumen.     Left Ear: Tympanic membrane, ear canal and external ear normal. There is no impacted cerumen.     Nose: Nose normal. No congestion or rhinorrhea.     Mouth/Throat:     Mouth: Mucous membranes are moist.     Pharynx:  Oropharynx is clear. No oropharyngeal exudate or posterior oropharyngeal erythema.  Eyes:     General: No scleral icterus.       Right eye: No discharge.        Left eye: No discharge.     Extraocular Movements: Extraocular movements intact.     Conjunctiva/sclera: Conjunctivae normal.     Pupils: Pupils are equal, round, and reactive to light.  Neck:     Vascular: No carotid bruit.  Cardiovascular:     Rate and Rhythm: Normal rate and regular rhythm.     Pulses: Normal pulses.     Heart sounds: Normal heart sounds. No murmur heard.    No friction rub. No gallop.  Pulmonary:     Effort: Pulmonary effort is normal. No respiratory distress.     Breath sounds: Normal breath sounds. No stridor. No wheezing, rhonchi or rales.  Chest:     Chest wall: No tenderness.  Abdominal:     General: Abdomen is flat. Bowel sounds are normal. There is no distension.     Palpations: There is no mass.     Tenderness: There is no abdominal tenderness. There is no right CVA tenderness, left CVA tenderness, guarding or rebound.     Hernia: No hernia is present.  Musculoskeletal:        General: No swelling, tenderness, deformity or signs of injury. Normal range of motion.     Cervical back: Normal range of motion and neck supple. No rigidity or tenderness.  Right lower leg: No edema.     Left lower leg: No edema.  Lymphadenopathy:     Cervical: No cervical adenopathy.  Skin:    General: Skin is warm and dry.     Capillary Refill: Capillary refill takes less than 2 seconds.     Coloration: Skin is not jaundiced or pale.     Findings: No bruising, erythema, lesion or rash.  Neurological:     General: No focal deficit present.     Mental Status: She is alert and oriented to person, place, and time. Mental status is at baseline.     Cranial Nerves: No cranial nerve deficit.     Sensory: No sensory deficit.     Motor: No weakness.     Coordination: Coordination normal.     Gait: Gait normal.      Deep Tendon Reflexes: Reflexes normal.  Psychiatric:        Mood and Affect: Mood normal.        Behavior: Behavior normal.        Thought Content: Thought content normal.        Judgment: Judgment normal.      Results for orders placed or performed in visit on 12/29/21  Vitamin D (25 hydroxy)  Result Value Ref Range   VITD 41.00 30.00 - 100.00 ng/mL  TSH  Result Value Ref Range   TSH 1.40 0.35 - 5.50 uIU/mL  Lipid panel  Result Value Ref Range   Cholesterol 141 0 - 200 mg/dL   Triglycerides 68.0 0.0 - 149.0 mg/dL   HDL 50.00 >39.00 mg/dL   VLDL 13.6 0.0 - 40.0 mg/dL   LDL Cholesterol 77 0 - 99 mg/dL   Total CHOL/HDL Ratio 3    NonHDL 91.01   Hemoglobin A1c  Result Value Ref Range   Hgb A1c MFr Bld 4.8 4.6 - 6.5 %  Comprehensive metabolic panel  Result Value Ref Range   Sodium 137 135 - 145 mEq/L   Potassium 4.0 3.5 - 5.1 mEq/L   Chloride 103 96 - 112 mEq/L   CO2 28 19 - 32 mEq/L   Glucose, Bld 104 (H) 70 - 99 mg/dL   BUN 12 6 - 23 mg/dL   Creatinine, Ser 1.00 0.40 - 1.20 mg/dL   Total Bilirubin 0.5 0.2 - 1.2 mg/dL   Alkaline Phosphatase 50 39 - 117 U/L   AST 15 0 - 37 U/L   ALT 16 0 - 35 U/L   Total Protein 7.5 6.0 - 8.3 g/dL   Albumin 4.1 3.5 - 5.2 g/dL   GFR 65.27 >60.00 mL/min   Calcium 9.1 8.4 - 10.5 mg/dL  CBC with Differential/Platelet  Result Value Ref Range   WBC 8.1 4.0 - 10.5 K/uL   RBC 4.26 3.87 - 5.11 Mil/uL   Hemoglobin 12.2 12.0 - 15.0 g/dL   HCT 38.1 36.0 - 46.0 %   MCV 89.3 78.0 - 100.0 fl   MCHC 32.0 30.0 - 36.0 g/dL   RDW 12.7 11.5 - 15.5 %   Platelets 225.0 150.0 - 400.0 K/uL   Neutrophils Relative % 67.1 43.0 - 77.0 %   Lymphocytes Relative 22.4 12.0 - 46.0 %   Monocytes Relative 7.9 3.0 - 12.0 %   Eosinophils Relative 1.9 0.0 - 5.0 %   Basophils Relative 0.7 0.0 - 3.0 %   Neutro Abs 5.5 1.4 - 7.7 K/uL   Lymphs Abs 1.8 0.7 - 4.0 K/uL   Monocytes Absolute 0.6 0.1 -  1.0 K/uL   Eosinophils Absolute 0.2 0.0 - 0.7 K/uL   Basophils  Absolute 0.1 0.0 - 0.1 K/uL      Assessment & Plan:    Routine Health Maintenance and Physical Exam  Immunization History  Administered Date(s) Administered   Influenza,inj,Quad PF,6+ Mos 05/28/2019   Influenza-Unspecified 05/18/2014, 05/31/2016   Tdap 10/23/2013   Zoster Recombinat (Shingrix) 12/21/2020, 03/22/2021    Health Maintenance  Topic Date Due   Hepatitis C Screening  Never done   COLONOSCOPY (Pts 45-20yr Insurance coverage will need to be confirmed)  Never done   PAP SMEAR-Modifier  12/29/2021 (Originally 12/03/2018)   COVID-19 Vaccine (1) 01/14/2022 (Originally 01/23/1971)   INFLUENZA VACCINE  02/01/2022   MAMMOGRAM  02/16/2022   TETANUS/TDAP  10/24/2023   HIV Screening  Completed   Zoster Vaccines- Shingrix  Completed   HPV VACCINES  Aged Out    Discussed health benefits of physical activity, and encouraged her to engage in regular exercise appropriate for her age and condition.  Problem List Items Addressed This Visit   None Visit Diagnoses     Screen for colon cancer    -  Primary   Relevant Orders   Ambulatory referral to Gastroenterology      Return in about 1 year (around 12/30/2022) for CPE and labs.     PLAN Exam unremarkable Labs collected this morning- results available now. Reviewed with patient. No concerns. Return annually for CPE and labs Patient encouraged to call clinic with any questions, comments, or concerns.   RMaximiano Coss NP

## 2021-12-29 NOTE — Patient Instructions (Addendum)
Ms. Sula -   No notes! You're doing great.  Keep it up.  See you next year, sooner if anything changes!  Thanks,  Rich     If you have lab work done today you will be contacted with your lab results within the next 2 weeks.  If you have not heard from [place] then please contact us. The fastest way to get your results is to register for My Chart.   IF you received an x-ray today, you will receive an invoice from [facility]. Please contact [facility] at [phone] with questions or concerns regarding your invoice.   IF you received labwork today, you will receive an invoice from [facility]. Please contact [facility] at [phone] with questions or concerns regarding your invoice.   Our billing staff will not be able to assist you with questions regarding bills from these companies.  You will be contacted with the lab results as soon as they are available. The fastest way to get your results is to activate your My Chart account. Instructions are located on the last page of this paperwork. If you have not heard from [place] regarding the results in 2 weeks, please contact this office.

## 2021-12-30 ENCOUNTER — Encounter: Payer: 59 | Admitting: Registered Nurse

## 2022-02-28 ENCOUNTER — Encounter: Payer: Self-pay | Admitting: Gastroenterology

## 2022-03-11 ENCOUNTER — Ambulatory Visit (AMBULATORY_SURGERY_CENTER): Payer: Self-pay

## 2022-03-11 ENCOUNTER — Other Ambulatory Visit: Payer: Self-pay

## 2022-03-11 VITALS — Ht 64.5 in | Wt 208.0 lb

## 2022-03-11 DIAGNOSIS — Z1211 Encounter for screening for malignant neoplasm of colon: Secondary | ICD-10-CM

## 2022-03-11 MED ORDER — NA SULFATE-K SULFATE-MG SULF 17.5-3.13-1.6 GM/177ML PO SOLN: 1.0000 | Freq: Once | ORAL | 0 refills | Status: AC

## 2022-03-11 NOTE — Progress Notes (Signed)
Denies allergies to eggs or soy products. Denies complication of anesthesia or sedation. Denies use of weight loss medication. Denies use of O2.   Emmi instructions given for colonoscopy.  

## 2022-03-21 ENCOUNTER — Encounter: Payer: Self-pay | Admitting: Gastroenterology

## 2022-04-05 ENCOUNTER — Ambulatory Visit (AMBULATORY_SURGERY_CENTER): Payer: 59 | Admitting: Gastroenterology

## 2022-04-05 ENCOUNTER — Encounter: Payer: Self-pay | Admitting: Gastroenterology

## 2022-04-05 VITALS — BP 157/86 | HR 72 | Temp 97.5°F | Resp 20 | Ht 64.0 in | Wt 208.0 lb

## 2022-04-05 DIAGNOSIS — Z1211 Encounter for screening for malignant neoplasm of colon: Secondary | ICD-10-CM | POA: Diagnosis not present

## 2022-04-05 DIAGNOSIS — K621 Rectal polyp: Secondary | ICD-10-CM

## 2022-04-05 DIAGNOSIS — K635 Polyp of colon: Secondary | ICD-10-CM | POA: Diagnosis not present

## 2022-04-05 DIAGNOSIS — D122 Benign neoplasm of ascending colon: Secondary | ICD-10-CM

## 2022-04-05 DIAGNOSIS — D128 Benign neoplasm of rectum: Secondary | ICD-10-CM

## 2022-04-05 MED ORDER — SODIUM CHLORIDE 0.9 % IV SOLN: 500.0000 mL | Freq: Once | INTRAVENOUS | Status: DC

## 2022-04-05 NOTE — Progress Notes (Signed)
Pt's states no medical or surgical changes since previsit or office visit. 

## 2022-04-05 NOTE — Patient Instructions (Addendum)
Thank you for coming in to see [place] today! Resume your regular diet and medications/supplements today. Return to regular daily activities tomorrow. Polyp biopsy results will be available in 1-2 weeks, at which time recommendation for future colonoscopy will be made.   YOU HAD AN ENDOSCOPIC PROCEDURE TODAY AT [facility]:   Refer to the procedure report that was given to you for any specific questions about what was found during the examination.  If the procedure report does not answer your questions, please call your gastroenterologist to clarify.  If you requested that your care partner not be given the details of your procedure findings, then the procedure report has been included in a sealed envelope for you to review at your convenience later.  YOU SHOULD EXPECT: Some feelings of bloating in the abdomen. Passage of more gas than usual.  Walking can help get rid of the air that was put into your GI tract during the procedure and reduce the bloating. If you had a lower endoscopy (such as a colonoscopy or flexible sigmoidoscopy) you may notice spotting of blood in your stool or on the toilet paper. If you underwent a bowel prep for your procedure, you may not have a normal bowel movement for a few days.  Please Note:  You might notice some irritation and congestion in your nose or some drainage.  This is from the oxygen used during your procedure.  There is no need for concern and it should clear up in a day or so.  SYMPTOMS TO REPORT IMMEDIATELY:  Following lower endoscopy (colonoscopy or flexible sigmoidoscopy):  Excessive amounts of blood in the stool  Significant tenderness or worsening of abdominal pains  Swelling of the abdomen that is new, acute  Fever of 100F or higher   For urgent or emergent issues, a gastroenterologist can be reached at any hour by calling [phone]. Do not use MyChart messaging for urgent concerns.    DIET:  We do recommend a small meal at  first, but then you may proceed to your regular diet.  Drink plenty of fluids but you should avoid alcoholic beverages for 24 hours.  ACTIVITY:  You should plan to take it easy for the rest of today and you should NOT DRIVE or use heavy machinery until tomorrow (because of the sedation medicines used during the test).    FOLLOW UP: Our staff will call the number listed on your records the next business day following your procedure.  We will call around 7:15- 8:00 am to check on you and address any questions or concerns that you may have regarding the information given to you following your procedure. If we do not reach you, we will leave a message.     If any biopsies were taken you will be contacted by phone or by letter within the next 1-3 weeks.  Please call us at [phone] if you have not heard about the biopsies in 3 weeks.    SIGNATURES/CONFIDENTIALITY: You and/or your care partner have signed paperwork which will be entered into your electronic medical record.  These signatures attest to the fact that that the information above on your After Visit Summary has been reviewed and is understood.  Full responsibility of the confidentiality of this discharge information lies with you and/or your care-partner.

## 2022-04-05 NOTE — Progress Notes (Signed)
Called to room to assist during endoscopic procedure.  Patient ID and intended procedure confirmed with present staff. Received instructions for my participation in the procedure from the performing physician.  

## 2022-04-05 NOTE — Progress Notes (Signed)
Sedate, gd SR, tolerated procedure well, VSS, report to RN 

## 2022-04-05 NOTE — Op Note (Signed)
[facility] Patient Name: Chelsea Mason Procedure Date: 04/05/2022 9:25 AM MRN: [id number] Endoscopist: Justice Britain , MD Age: 51 Referring MD:  Date of Birth: 1970-09-06 Gender: Female Account #: [id number] Procedure:                Colonoscopy Indications:              Screening for colorectal malignant neoplasm Medicines:                Monitored Anesthesia Care Procedure:                Pre-Anesthesia Assessment:                           - Prior to the procedure, a History and Physical                            was performed, and patient medications and                            allergies were reviewed. The patient's tolerance of                            previous anesthesia was also reviewed. The risks                            and benefits of the procedure and the sedation                            options and risks were discussed with the patient.                            All questions were answered, and informed consent                            was obtained. Prior Anticoagulants: The patient has                            taken no previous anticoagulant or antiplatelet                            agents except for NSAID medication. ASA Grade                            Assessment: I - A normal, healthy patient. After                            reviewing the risks and benefits, the patient was                            deemed in satisfactory condition to undergo the                            procedure.  After obtaining informed consent, the colonoscope                            was passed under direct vision. Throughout the                            procedure, the patient's blood pressure, pulse, and                            oxygen saturations were monitored continuously. The                            CF [id number] #[id number] was introduced through the anus                            and advanced to the the cecum, identified by                             appendiceal orifice and ileocecal valve. The                            colonoscopy was performed without difficulty. The                            patient tolerated the procedure. The quality of the                            bowel preparation was adequate. The ileocecal                            valve, appendiceal orifice, and rectum were                            photographed. Scope In: 9:30:23 AM Scope Out: 9:50:11 AM Scope Withdrawal Time: 0 hours 14 minutes 15 seconds  Total Procedure Duration: 0 hours 19 minutes 48 seconds  Findings:                 The digital rectal exam findings include                            hemorrhoids. Pertinent negatives include no                            palpable rectal lesions.                           Six sessile polyps were found in the rectum (5) and                            ascending colon (1). The polyps were 2 to 6 mm in                            size. These polyps were removed with a cold snare.  Resection and retrieval were complete.                           Multiple small-mouthed diverticula were found in                            the transverse colon and ascending colon.                           Normal mucosa was found in the entire colon                            otherwise.                           Non-bleeding non-thrombosed external and internal                            hemorrhoids were found during retroflexion, during                            perianal exam and during digital exam. The                            hemorrhoids were Grade II (internal hemorrhoids                            that prolapse but reduce spontaneously). Complications:            No immediate complications. Estimated Blood Loss:     Estimated blood loss was minimal. Impression:               - Hemorrhoids found on digital rectal exam.                           - Six 2 to 6 mm polyps in the rectum  and in the                            ascending colon, removed with a cold snare.                            Resected and retrieved.                           - Diverticulosis in the transverse colon and in the                            ascending colon.                           - Normal mucosa in the entire examined colon                            otherwise.                           -  Non-bleeding non-thrombosed external and internal                            hemorrhoids. Recommendation:           - The patient will be observed post-procedure,                            until all discharge criteria are met.                           - Discharge patient to home.                           - Patient has a contact number available for                            emergencies. The signs and symptoms of potential                            delayed complications were discussed with the                            patient. Return to normal activities tomorrow.                            Written discharge instructions were provided to the                            patient.                           - High fiber diet.                           - Use FiberCon 1-2 tablets PO daily.                           - Continue present medications.                           - Await pathology results.                           - Repeat colonoscopy in 3/11/07/08 years for                            surveillance based on pathology results and                            findings of adenomatous tissue.                           - The findings and recommendations were discussed                            with the patient.                           -  The findings and recommendations were discussed                            with the designated responsible adult. Justice Britain, MD 04/05/2022 9:55:57 AM

## 2022-04-05 NOTE — Progress Notes (Signed)
GASTROENTEROLOGY PROCEDURE H&P NOTE   Primary Care Physician: Maximiano Coss, NP  HPI: Chelsea Mason is a 51 y.o. female who presents for colonoscopy for screening.  Past Medical History:  Diagnosis Date   Allergy    Anemia    Past Surgical History:  Procedure Laterality Date   BUNIONECTOMY     BUNIONECTOMY Left 12/21/2013   Dr. Craig Staggers Regal   BUNIONECTOMY Right 06/17/2014   Dr. Ila Mcgill   CESAREAN SECTION     1) 11/2003  2)02/2005   Current Outpatient Medications  Medication Sig Dispense Refill   cetirizine (ZYRTEC) 10 MG tablet Take 10 mg by mouth daily.     ibuprofen (ADVIL,MOTRIN) 200 MG tablet Take 200 mg by mouth.     loratadine (CLARITIN) 10 MG tablet Take 1 tablet every day by oral route as needed.     Multiple Vitamin (MULTIVITAMIN) tablet Take 1 tablet by mouth daily.     No current facility-administered medications for this visit.    Current Outpatient Medications:    cetirizine (ZYRTEC) 10 MG tablet, Take 10 mg by mouth daily., Disp: , Rfl:    ibuprofen (ADVIL,MOTRIN) 200 MG tablet, Take 200 mg by mouth., Disp: , Rfl:    loratadine (CLARITIN) 10 MG tablet, Take 1 tablet every day by oral route as needed., Disp: , Rfl:    Multiple Vitamin (MULTIVITAMIN) tablet, Take 1 tablet by mouth daily., Disp: , Rfl:  No Known Allergies Family History  Problem Relation Age of Onset   Hypertension Mother    Heart disease Maternal Grandfather    Stroke Maternal Grandfather    Diabetes Paternal Grandmother    Colon cancer Neg Hx    Esophageal cancer Neg Hx    Stomach cancer Neg Hx    Rectal cancer Neg Hx    Social History   Socioeconomic History   Marital status: Divorced    Spouse name: Not on file   Number of children: Not on file   Years of education: Not on file   Highest education level: Not on file  Occupational History   Occupation: Risk analyst    Employer: [employer]  Tobacco Use   Smoking status: Never   Smokeless tobacco: Never   Vaping Use   Vaping Use: Never used  Substance and Sexual Activity   Alcohol use: No    Alcohol/week: 0.0 standard drinks of alcohol   Drug use: No   Sexual activity: Not Currently  Other Topics Concern   Not on file  Social History Narrative   Not on file   Social Determinants of Health   Financial Resource Strain: Low Risk  (11/20/2018)   Overall Financial Resource Strain (CARDIA)    Difficulty of Paying Living Expenses: Not hard at all  Food Insecurity: No Food Insecurity (11/20/2018)   Hunger Vital Sign    Worried About Running Out of Food in the Last Year: Never true    [place] in the Last Year: Never true  Transportation Needs: No Transportation Needs (11/20/2018)   PRAPARE - Hydrologist (Medical): No    Lack of Transportation (Non-Medical): No  Physical Activity: Inactive (11/20/2018)   Exercise Vital Sign    Days of Exercise per Week: 0 days    Minutes of Exercise per Session: 0 min  Stress: Stress Concern Present (11/20/2018)   [place]    Feeling of Stress : To some extent  Social Connections: Somewhat Isolated (11/20/2018)   Social Connection and Isolation Panel [NHANES]    Frequency of Communication with Friends and Family: More than three times a week    Frequency of Social Gatherings with Friends and Family: Three times a week    Attends Religious Services: More than 4 times per year    Active Member of Clubs or Organizations: No    Attends Archivist Meetings: Never    Marital Status: Divorced  Human resources officer Violence: Not At Risk (11/20/2018)   Humiliation, Afraid, Rape, and Kick questionnaire    Fear of Current or Ex-Partner: No    Emotionally Abused: No    Physically Abused: No    Sexually Abused: No    Physical Exam: There were no vitals filed for this visit. There is no height or weight on file to calculate BMI. GEN: NAD EYE: Sclerae  anicteric ENT: MMM CV: Non-tachycardic GI: Soft, NT/ND NEURO:  Alert & Oriented x 3  Lab Results: No results for input(s): "WBC", "HGB", "HCT", "PLT" in the last 72 hours. BMET No results for input(s): "NA", "K", "CL", "CO2", "GLUCOSE", "BUN", "CREATININE", "CALCIUM" in the last 72 hours. LFT No results for input(s): "PROT", "ALBUMIN", "AST", "ALT", "ALKPHOS", "BILITOT", "BILIDIR", "IBILI" in the last 72 hours. PT/INR No results for input(s): "LABPROT", "INR" in the last 72 hours.   Impression / Plan: This is a 51 y.o.female who presents for colonoscopy for screening.  The risks and benefits of endoscopic evaluation/treatment were discussed with the patient and/or family; these include but are not limited to the risk of perforation, infection, bleeding, missed lesions, lack of diagnosis, severe illness requiring hospitalization, as well as anesthesia and sedation related illnesses.  The patient's history has been reviewed, patient examined, no change in status, and deemed stable for procedure.  The patient and/or family is agreeable to proceed.    Justice Britain, MD [facility] Office # [phone]

## 2022-04-06 ENCOUNTER — Telehealth: Payer: Self-pay

## 2022-04-06 NOTE — Telephone Encounter (Signed)
  Follow up Call-     04/05/2022    8:44 AM  Call back number  Post procedure Call Back phone  # [phone]  Permission to leave phone message Yes     Patient questions:  Do you have a fever, pain , or abdominal swelling? No. Pain Score  0 *  Have you tolerated food without any problems? Yes.    Have you been able to return to your normal activities? Yes.    Do you have any questions about your discharge instructions: Diet   No. Medications  No. Follow up visit  No.  Do you have questions or concerns about your Care? No.  Actions: * If pain score is 4 or above: No action needed, pain <4.

## 2022-04-08 ENCOUNTER — Encounter: Payer: Self-pay | Admitting: Gastroenterology

## 2022-04-12 ENCOUNTER — Telehealth: Payer: Self-pay | Admitting: Gastroenterology

## 2022-04-12 NOTE — Telephone Encounter (Signed)
Inbound call from patient wondering if she should be concerned about having diverticulitis and if she should change her diet. She is requesting a call back.

## 2022-04-12 NOTE — Telephone Encounter (Signed)
The pt has been advised that she does not have diverticulitis.  She was given the explanation of diverticulosis and the signs to look out for with diverticulitis.  She has been advised to follow a high fiber diet per recent procedure report. The pt has been advised of the information and verbalized understanding.

## 2022-11-25 ENCOUNTER — Encounter: Payer: Self-pay | Admitting: Internal Medicine

## 2022-11-25 ENCOUNTER — Ambulatory Visit: Payer: 59 | Attending: Internal Medicine | Admitting: Internal Medicine

## 2022-11-25 VITALS — BP 130/88 | HR 92 | Temp 98.2°F | Ht 64.0 in | Wt 207.0 lb

## 2022-11-25 DIAGNOSIS — Z1159 Encounter for screening for other viral diseases: Secondary | ICD-10-CM

## 2022-11-25 DIAGNOSIS — E669 Obesity, unspecified: Secondary | ICD-10-CM

## 2022-11-25 DIAGNOSIS — R030 Elevated blood-pressure reading, without diagnosis of hypertension: Secondary | ICD-10-CM

## 2022-11-25 DIAGNOSIS — H6122 Impacted cerumen, left ear: Secondary | ICD-10-CM

## 2022-11-25 DIAGNOSIS — Z0000 Encounter for general adult medical examination without abnormal findings: Secondary | ICD-10-CM

## 2022-11-25 DIAGNOSIS — Z7689 Persons encountering health services in other specified circumstances: Secondary | ICD-10-CM

## 2022-11-25 NOTE — Patient Instructions (Signed)

## 2022-11-25 NOTE — Progress Notes (Signed)
Patient ID: Chelsea Mason, female    DOB: February 06, 1971  MRN: [id number]  CC: Establish Care (Est care / new pt. /No questions/ concerns. )   Subjective: Chelsea Mason is a 52 y.o. female who presents for new pt visit/physical Her concerns today include:   Patient presents today to establish care.  Previous PCP was Chelsea Mason with [facility].  Decided to change because he left.  No chronic medical issues and not on any chronic medications.  Pt BP elev today.  No issues with BP in past.  Review of her flow sheet in the system however shows BP has been a little elev.  Reports some increased life stresses over the past 2 years may have contributed. -limits salt in foods No CP/SOB  Obesity:  This past yr she was working late and eating late. Now more aware and plans to become more active.  Likes to walk and wants to find a line dance group  HM:  Due for MMG in 01/2023 through [facility]; pap last yr there as well   Patient Active Problem List   Diagnosis Date Noted   Bilateral bunions 10/23/2013   Allergic rhinitis, cause unspecified 10/23/2013   Vitamin D deficiency 10/02/2012   Health care maintenance 10/02/2012     Current Outpatient Medications on File Prior to Visit  Medication Sig Dispense Refill   ibuprofen (ADVIL,MOTRIN) 200 MG tablet Take 200 mg by mouth.     loratadine (CLARITIN) 10 MG tablet Take 1 tablet every day by oral route as needed.     Multiple Vitamin (MULTIVITAMIN) tablet Take 1 tablet by mouth daily.     No current facility-administered medications on file prior to visit.    No Known Allergies  Social History   Socioeconomic History   Marital status: Divorced    Spouse name: Not on file   Number of children: 2   Years of education: Not on file   Highest education level: Master's degree (e.g., MA, MS, MEng, MEd, MSW, MBA)  Occupational History   Occupation: Risk analyst: [employer]  Tobacco Use    Smoking status: Never   Smokeless tobacco: Never  Vaping Use   Vaping Use: Never used  Substance and Sexual Activity   Alcohol use: No    Alcohol/week: 0.0 standard drinks of alcohol   Drug use: No   Sexual activity: Not Currently  Other Topics Concern   Not on file  Social History Narrative   Not on file   Social Determinants of Health   Financial Resource Strain: Low Risk  (11/20/2018)   Overall Financial Resource Strain (CARDIA)    Difficulty of Paying Living Expenses: Not hard at all  Food Insecurity: No Food Insecurity (11/20/2018)   Hunger Vital Sign    Worried About Running Out of Food in the Last Year: Never true    Ran Out of Food in the Last Year: Never true  Transportation Needs: No Transportation Needs (11/20/2018)   PRAPARE - Administrator, Civil Service (Medical): No    Lack of Transportation (Non-Medical): No  Physical Activity: Inactive (11/20/2018)   Exercise Vital Sign    Days of Exercise per Week: 0 days    Minutes of Exercise per Session: 0 min  Stress: Stress Concern Present (11/20/2018)   [employer] of Occupational Health - Occupational Stress Questionnaire    Feeling of Stress : To some extent  Social Connections: Somewhat Isolated (  11/20/2018)   Social Connection and Isolation Panel [NHANES]    Frequency of Communication with Friends and Family: More than three times a week    Frequency of Social Gatherings with Friends and Family: Three times a week    Attends Religious Services: More than 4 times per year    Active Member of Clubs or Organizations: No    Attends Banker Meetings: Never    Marital Status: Divorced  Catering manager Violence: Not At Risk (11/20/2018)   Humiliation, Afraid, Rape, and Kick questionnaire    Fear of Current or Ex-Partner: No    Emotionally Abused: No    Physically Abused: No    Sexually Abused: No    Family History  Problem Relation Age of Onset   Hypertension Mother    Heart disease  Maternal Grandfather    Stroke Maternal Grandfather    Diabetes Paternal Grandmother    Colon cancer Neg Hx    Esophageal cancer Neg Hx    Stomach cancer Neg Hx    Rectal cancer Neg Hx     Past Surgical History:  Procedure Laterality Date   BUNIONECTOMY     BUNIONECTOMY Left 12/21/2013   Dr. Leonides Grills Regal   BUNIONECTOMY Right 06/17/2014   Dr. Cristie Hem   CESAREAN SECTION     1) 11/2003  2)02/2005    ROS: Review of Systems  HENT:  Negative for congestion, hearing loss, sore throat and trouble swallowing.        She sees the dentist at least twice a year for routine cleaning.  Eyes:        Wears prescription glasses.  Reports having eye exam at least once a year.  Respiratory:  Negative for cough and shortness of breath.   Cardiovascular:  Negative for chest pain.  Gastrointestinal:  Negative for blood in stool.       Moving bowels okay on regularly.  Genitourinary:  Negative for difficulty urinating.   Negative except as stated above  PHYSICAL EXAM: BP 130/88   Pulse 92   Temp 98.2 F (36.8 C) (Oral)   Ht 5\' 4"  (1.626 m)   Wt 207 lb (93.9 kg)   SpO2 97%   BMI 35.53 kg/m   Wt Readings from Last 3 Encounters:  11/25/22 207 lb (93.9 kg)  04/05/22 208 lb (94.3 kg)  03/11/22 208 lb (94.3 kg)    Physical Exam  General appearance - alert, well appearing, and in no distress Mental status - normal mood, behavior, speech, dress, motor activity, and thought processes Eyes - pupils equal and reactive, extraocular eye movements intact Ears -small amount of wax in the right ear canal.  Canal and membrane are within normal limits.  She has moderate amount of soft wax in the left ear canal obscuring view of the canal and tympanic membrane. Nose - normal and patent, no erythema, discharge or polyps Mouth - mucous membranes moist, pharynx normal without lesions Neck - supple, no significant adenopathy Lymphatics - no palpable lymphadenopathy, no hepatosplenomegaly Chest -  clear to auscultation, no wheezes, rales or rhonchi, symmetric air entry Heart - normal rate, regular rhythm, normal S1, S2, no murmurs, rubs, clicks or gallops Abdomen - soft, nontender, nondistended, no masses or organomegaly Musculoskeletal - no joint tenderness, deformity or swelling Extremities - peripheral pulses normal, no pedal edema, no clubbing or cyanosis Skin - normal coloration and turgor, no rashes, no suspicious skin lesions noted     11/25/2022    9:08 AM  12/29/2021    2:36 PM 12/17/2019   12:59 PM  Depression screen PHQ 2/9  Decreased Interest 0 0 0  Down, Depressed, Hopeless 0 0 0  PHQ - 2 Score 0 0 0  Altered sleeping 0 0   Tired, decreased energy 0 0   Change in appetite 0 0   Feeling bad or failure about yourself  0 0   Trouble concentrating 0 0   Moving slowly or fidgety/restless 0 0   Suicidal thoughts 0 0   PHQ-9 Score 0 0   Difficult doing work/chores  Not difficult at all         Latest Ref Rng & Units 12/29/2021    8:14 AM 12/07/2020    9:11 AM 12/12/2019    9:51 AM  CMP  Glucose 70 - 99 mg/dL 409  92  811   BUN 6 - 23 mg/dL 12  12  12    Creatinine 0.40 - 1.20 mg/dL 9.14  7.82  9.56   Sodium 135 - 145 mEq/L 137  137  140   Potassium 3.5 - 5.1 mEq/L 4.0  4.1  4.3   Chloride 96 - 112 mEq/L 103  102  104   CO2 19 - 32 mEq/L 28  22  23    Calcium 8.4 - 10.5 mg/dL 9.1  9.4  9.2   Total Protein 6.0 - 8.3 g/dL 7.5  7.4  7.4   Total Bilirubin 0.2 - 1.2 mg/dL 0.5  0.7  0.3   Alkaline Phos 39 - 117 U/L 50  49  66   AST 0 - 37 U/L 15  15  15    ALT 0 - 35 U/L 16  11  12     Lipid Panel     Component Value Date/Time   CHOL 141 12/29/2021 0814   CHOL 146 12/12/2019 0951   TRIG 68.0 12/29/2021 0814   HDL 50.00 12/29/2021 0814   HDL 49 12/12/2019 0951   CHOLHDL 3 12/29/2021 0814   VLDL 13.6 12/29/2021 0814   LDLCALC 77 12/29/2021 0814   LDLCALC 86 12/12/2019 0951    CBC    Component Value Date/Time   WBC 8.1 12/29/2021 0814   RBC 4.26 12/29/2021  0814   HGB 12.2 12/29/2021 0814   HGB 12.5 12/12/2019 0951   HCT 38.1 12/29/2021 0814   HCT 39.0 12/12/2019 0951   PLT 225.0 12/29/2021 0814   PLT 233 12/12/2019 0951   MCV 89.3 12/29/2021 0814   MCV 90 12/12/2019 0951   MCH 28.7 12/12/2019 0951   MCH 29.4 12/16/2015 0913   MCH 28.3 12/11/2015 0939   MCHC 32.0 12/29/2021 0814   RDW 12.7 12/29/2021 0814   RDW 11.2 (L) 12/12/2019 0951   LYMPHSABS 1.8 12/29/2021 0814   LYMPHSABS 1.9 12/12/2019 0951   MONOABS 0.6 12/29/2021 0814   EOSABS 0.2 12/29/2021 0814   EOSABS 0.1 12/12/2019 0951   BASOSABS 0.1 12/29/2021 0814   BASOSABS 0.1 12/12/2019 0951    ASSESSMENT AND PLAN: 1. Establishing care with new doctor, encounter for   2. Annual physical exam Patient will sign release for [place] to get results of mammogram and Pap smear that she had done last year through [facility]  3. Obesity (BMI 35.0-39.9 without comorbidity) Patient advised to eliminate sugary drinks from the diet, cut back on portion sizes especially of white carbohydrates, eat more white lean meat like chicken [place] and seafood instead of beef or pork and incorporate fresh fruits and vegetables  into the diet daily. -Encourage her to get in about 150 minutes/wk total of moderate intensity exercise. - CBC - Comprehensive metabolic panel - Lipid panel  4. Elevated blood pressure reading in office without diagnosis of hypertension Advised normal blood pressures 120/80 or lower DASH diet discussed and encouraged.  Printed information also given.  Advised patient that if she gets access to automated blood pressure monitoring device, try to check the blood pressure at least once a week and record the readings.  Will have her follow-up with the clinical pharmacist in about 1 month  5. Impacted cerumen of left ear Recommend using some wax softener over-the-counter  6. Need for hepatitis C screening test Patient agreeable to screening. - Hepatitis C  Antibody     Patient was given the opportunity to ask questions.  Patient verbalized understanding of the plan and was able to repeat key elements of the plan.   This documentation was completed using Paediatric nurse.  Any transcriptional errors are unintentional.  Orders Placed This Encounter  Procedures   CBC   Comprehensive metabolic panel   Lipid panel   Hepatitis C Antibody     Requested Prescriptions    No prescriptions requested or ordered in this encounter    Return in about 6 months (around 05/28/2023) for  Give appt Luke 4 wks for BP checkSign release to get PAP/MMG report from [facility].  Jonah Blue, MD, FACP

## 2022-11-26 LAB — LIPID PANEL
Chol/HDL Ratio: 2.8 ratio (ref 0.0–4.4)
Cholesterol, Total: 146 mg/dL (ref 100–199)
HDL: 53 mg/dL (ref >39)
LDL Chol Calc (NIH): 81 mg/dL (ref 0–99)
Triglycerides: 58 mg/dL (ref 0–149)
VLDL Cholesterol Cal: 12 mg/dL (ref 5–40)

## 2022-11-26 LAB — COMPREHENSIVE METABOLIC PANEL
ALT: 7 IU/L (ref 0–32)
AST: 12 IU/L (ref 0–40)
Albumin/Globulin Ratio: 1.5 (ref 1.2–2.2)
Albumin: 4.4 g/dL (ref 3.8–4.9)
Alkaline Phosphatase: 64 IU/L (ref 44–121)
BUN/Creatinine Ratio: 10 (ref 9–23)
BUN: 10 mg/dL (ref 6–24)
Bilirubin Total: 0.4 mg/dL (ref 0.0–1.2)
CO2: 25 mmol/L (ref 20–29)
Calcium: 9.2 mg/dL (ref 8.7–10.2)
Chloride: 104 mmol/L (ref 96–106)
Creatinine, Ser: 0.98 mg/dL (ref 0.57–1.00)
Globulin, Total: 3 g/dL (ref 1.5–4.5)
Glucose: 103 mg/dL — ABNORMAL HIGH (ref 70–99)
Potassium: 4.5 mmol/L (ref 3.5–5.2)
Sodium: 142 mmol/L (ref 134–144)
Total Protein: 7.4 g/dL (ref 6.0–8.5)
eGFR: 69 mL/min/{1.73_m2} (ref >59)

## 2022-11-26 LAB — HEPATITIS C ANTIBODY: Hep C Virus Ab: NONREACTIVE

## 2022-11-26 LAB — CBC
Hematocrit: 38.7 % (ref 34.0–46.6)
Hemoglobin: 12.1 g/dL (ref 11.1–15.9)
MCH: 28.3 pg (ref 26.6–33.0)
MCHC: 31.3 g/dL — ABNORMAL LOW (ref 31.5–35.7)
MCV: 91 fL (ref 79–97)
Platelets: 237 10*3/uL (ref 150–450)
RBC: 4.27 x10E6/uL (ref 3.77–5.28)
RDW: 11.2 % — ABNORMAL LOW (ref 11.7–15.4)
WBC: 5.9 10*3/uL (ref 3.4–10.8)

## 2022-12-29 NOTE — Progress Notes (Unsigned)
   S:     No chief complaint on file.  52 y.o. female who presents for hypertension evaluation, education, and management.  PMH is significant for elevated BP, anemia, allergic rhinitis, .  Patient was referred and last seen by Primary Care Provider, Dr. Laural Benes, on 11/25/2022. Patient presented to establish care. At last visit, BP 130/88.  Today, patient arrives in *** spirits and presents without *** assistance. *** Denies dizziness, headache, blurred vision, swelling.   Patient reports hypertension was diagnosed in ***.   Family/Social history:  - Fhx: HTN, heart disease, stroke, DM -Tobacco: never smoker  Medication adherence *** . Patient has *** taken BP medications today.   Current antihypertensives include: ***  Antihypertensives tried in the past include: ***  Reported home BP readings: ***  Patient reported dietary habits: Eats *** meals/day Breakfast: *** Lunch: *** Dinner: *** Snacks: *** Drinks: ***  Patient-reported exercise habits: ***  ASCVD risk factors include: ***  O:  ROS  Physical Exam  Last 3 Office BP readings: BP Readings from Last 3 Encounters:  11/25/22 130/88  04/05/22 (!) 157/86  12/29/21 132/78    BMET    Component Value Date/Time   NA 142 11/25/2022 0957   K 4.5 11/25/2022 0957   CL 104 11/25/2022 0957   CO2 25 11/25/2022 0957   GLUCOSE 103 (H) 11/25/2022 0957   GLUCOSE 104 (H) 12/29/2021 0814   BUN 10 11/25/2022 0957   CREATININE 0.98 11/25/2022 0957   CREATININE 0.85 12/11/2015 0939   CALCIUM 9.2 11/25/2022 0957   GFRNONAA 71 12/12/2019 0951   GFRNONAA 83 12/11/2015 0939   GFRAA 81 12/12/2019 0951   GFRAA >89 12/11/2015 0939    Renal function: CrCl cannot be calculated (Patient's most recent lab result is older than the maximum 21 days allowed.).  Clinical ASCVD: {YES/NO:21197} The 10-year ASCVD risk score (Arnett DK, et al., 2019) is: 2%   Values used to calculate the score:     Age: 52 years     Sex: Female      Is Non-Hispanic African American: Yes     Diabetic: No     Tobacco smoker: No     Systolic Blood Pressure: 130 mmHg     Is BP treated: No     HDL Cholesterol: 53 mg/dL     Total Cholesterol: 146 mg/dL  Patient is participating in a Managed Medicaid Plan:  {MM YES/NO:27447::"Yes"}    A/P: Hypertension diagnosed *** currently *** on current medications. BP goal < 130/80 *** mmHg. Medication adherence appears ***. Control is suboptimal due to ***.  -{Meds adjust:18428} ***.  -{Meds adjust:18428} ***.  -Patient educated on purpose, proper use, and potential adverse effects of ***.  -F/u labs ordered - *** -Counseled on lifestyle modifications for blood pressure control including reduced dietary sodium, increased exercise, adequate sleep. -Encouraged patient to check BP at home and bring log of readings to next visit. Counseled on proper use of home BP cuff.   Results reviewed and written information provided.    Written patient instructions provided. Patient verbalized understanding of treatment plan.  Total time in face to face counseling *** minutes.    Follow-up:  Pharmacist ***. PCP clinic visit in ***.  Patient seen with ***

## 2022-12-30 ENCOUNTER — Ambulatory Visit: Payer: 59 | Admitting: Pharmacist

## 2023-01-02 ENCOUNTER — Encounter: Payer: 59 | Admitting: Registered Nurse

## 2023-03-24 ENCOUNTER — Ambulatory Visit: Payer: 59 | Admitting: Internal Medicine

## 2023-05-09 LAB — HM MAMMOGRAPHY

## 2023-05-29 ENCOUNTER — Encounter: Payer: Self-pay | Admitting: Internal Medicine

## 2023-05-29 ENCOUNTER — Ambulatory Visit: Payer: 59 | Attending: Internal Medicine | Admitting: Internal Medicine

## 2023-05-29 VITALS — BP 149/88 | HR 100 | Temp 98.2°F | Ht 64.0 in | Wt 206.0 lb

## 2023-05-29 DIAGNOSIS — E66812 Obesity, class 2: Secondary | ICD-10-CM | POA: Diagnosis not present

## 2023-05-29 DIAGNOSIS — I10 Essential (primary) hypertension: Secondary | ICD-10-CM | POA: Diagnosis not present

## 2023-05-29 DIAGNOSIS — Z6835 Body mass index (BMI) 35.0-35.9, adult: Secondary | ICD-10-CM

## 2023-05-29 MED ORDER — AMLODIPINE BESYLATE 5 MG PO TABS: 5.0000 mg | ORAL_TABLET | Freq: Every day | ORAL | 3 refills | Status: DC

## 2023-05-29 NOTE — Patient Instructions (Addendum)
Please check your blood pressure at least twice a week and record the readings.  Once we start blood pressure medication, our goal is to keep your blood pressure 130/80 or lower.  We will have you follow-up with our clinical pharmacist in 1 month for recheck of blood pressure.  Bring your readings with you on your blood pressure monitoring device.  Please remember to send [place] a copy/documentation of your flu shot and COVID-vaccine booster once given.

## 2023-05-29 NOTE — Progress Notes (Signed)
Patient ID: Chelsea Mason, female    DOB: [birth year]  MRN: [id number]  CC: Follow-up (Follow-up. /No questions / concerns/Has appt to get flu & covid. Instructed to sign ROI for mammogram)   Subjective: Chelsea Mason is a 52 y.o. female who presents for chronic ds management. Her concerns today include:  Patient with hx of obesity  On last visit 11/2022 BP was elev.  DASH discussed and encouraged. Purchased home BP device but has not used it. Doing okay with eating  No CP/SOB/LE edema Wgh has remained stable but still over wgh.  Still works late but eating last meal a little early.  "I need to walk more."  However, she thinks she needs to do better and has made up her mind to do so.  Parks further from the building where she works.  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  Plans to start taking the steps in the mornings.    HM: just had MMG 11/9/204 through [facility].  PAP scheduled for 07/2023.  Has appt for flu and COVID vaccine at [facility].   Patient Active Problem List   Diagnosis Date Noted   Bilateral bunions 10/23/2013   Allergic rhinitis 10/23/2013   Vitamin D deficiency 10/02/2012   Health care maintenance 10/02/2012     Current Outpatient Medications on File Prior to Visit  Medication Sig Dispense Refill   loratadine (CLARITIN) 10 MG tablet Take 1 tablet every day by oral route as needed.     Multiple Vitamin (MULTIVITAMIN) tablet Take 1 tablet by mouth daily.     ibuprofen (ADVIL,MOTRIN) 200 MG tablet Take 200 mg by mouth.     No current facility-administered medications on file prior to visit.    No Known Allergies  Social History   Socioeconomic History   Marital status: Divorced    Spouse name: Not on file   Number of children: 2   Years of education: Not on file   Highest education level: Master's degree (e.g., MA, MS, MEng, MEd, MSW, MBA)  Occupational History   Occupation: Risk analyst: [employer]  Tobacco Use   Smoking status: Never    Smokeless tobacco: Never  Vaping Use   Vaping status: Never Used  Substance and Sexual Activity   Alcohol use: No    Alcohol/week: 0.0 standard drinks of alcohol   Drug use: No   Sexual activity: Not Currently  Other Topics Concern   Not on file  Social History Narrative   Not on file   Social Determinants of Health   Financial Resource Strain: Low Risk  (05/28/2023)   Overall Financial Resource Strain (CARDIA)    Difficulty of Paying Living Expenses: Not very hard  Food Insecurity: No Food Insecurity (05/28/2023)   Hunger Vital Sign    Worried About Running Out of Food in the Last Year: Never true    Ran Out of Food in the Last Year: Never true  Transportation Needs: No Transportation Needs (05/28/2023)   PRAPARE - Administrator, Civil Service (Medical): No    Lack of Transportation (Non-Medical): No  Physical Activity: Inactive (05/28/2023)   Exercise Vital Sign    Days of Exercise per Week: 0 days    Minutes of Exercise per Session: 10 min  Stress: No Stress Concern Present (05/28/2023)   [employer] of Occupational Health - Occupational Stress Questionnaire    Feeling of Stress : Not at all  Social Connections: Moderately Integrated (05/28/2023)  Social Connection and Isolation Panel [NHANES]    Frequency of Communication with Friends and Family: More than three times a week    Frequency of Social Gatherings with Friends and Family: Three times a week    Attends Religious Services: More than 4 times per year    Active Member of Clubs or Organizations: Yes    Attends Banker Meetings: More than 4 times per year    Marital Status: Divorced  Intimate Partner Violence: Unknown (10/08/2021)   Received from [employer]   HITS    Physically Hurt: Not on file    Insult or Talk Down To: Not on file    Threaten Physical Harm: Not on file    Scream or Curse: Not on file    Family History  Problem Relation Age of Onset    Hypertension Mother    Heart disease Maternal Grandfather    Stroke Maternal Grandfather    Diabetes Paternal Grandmother    Colon cancer Neg Hx    Esophageal cancer Neg Hx    Stomach cancer Neg Hx    Rectal cancer Neg Hx     Past Surgical History:  Procedure Laterality Date   BUNIONECTOMY     BUNIONECTOMY Left 12/21/2013   Dr. Leonides Grills Regal   BUNIONECTOMY Right 06/17/2014   Dr. Cristie Hem   CESAREAN SECTION     1) 11/2003  2)02/2005    ROS: Review of Systems Negative except as stated above  PHYSICAL EXAM: BP (!) 154/89 (BP Location: Left Arm, Patient Position: Sitting, Cuff Size: Normal)   Pulse 100   Temp 98.2 F (36.8 C) (Oral)   Ht 5\' 4"  (1.626 m)   Wt 206 lb (93.4 kg)   SpO2 99%   BMI 35.36 kg/m   Wt Readings from Last 3 Encounters:  05/29/23 206 lb (93.4 kg)  11/25/22 207 lb (93.9 kg)  04/05/22 208 lb (94.3 kg)    Physical Exam  General appearance - alert, well appearing, and in no distress Mental status - normal mood, behavior, speech, dress, motor activity, and thought processes Neck - supple, no significant adenopathy Chest - clear to auscultation, no wheezes, rales or rhonchi, symmetric air entry Heart - normal rate, regular rhythm, normal S1, S2, no murmurs, rubs, clicks or gallops Extremities - peripheral pulses normal, no pedal edema, no clubbing or cyanosis      Latest Ref Rng & Units 11/25/2022    9:57 AM 12/29/2021    8:14 AM 12/07/2020    9:11 AM  CMP  Glucose 70 - 99 mg/dL 409  811  92   BUN 6 - 24 mg/dL 10  12  12    Creatinine 0.57 - 1.00 mg/dL 9.14  7.82  9.56   Sodium 134 - 144 mmol/L 142  137  137   Potassium 3.5 - 5.2 mmol/L 4.5  4.0  4.1   Chloride 96 - 106 mmol/L 104  103  102   CO2 20 - 29 mmol/L 25  28  22    Calcium 8.7 - 10.2 mg/dL 9.2  9.1  9.4   Total Protein 6.0 - 8.5 g/dL 7.4  7.5  7.4   Total Bilirubin 0.0 - 1.2 mg/dL 0.4  0.5  0.7   Alkaline Phos 44 - 121 IU/L 64  50  49   AST 0 - 40 IU/L 12  15  15    ALT 0 - 32 IU/L 7   16  11  Lipid Panel     Component Value Date/Time   CHOL 146 11/25/2022 0957   TRIG 58 11/25/2022 0957   HDL 53 11/25/2022 0957   CHOLHDL 2.8 11/25/2022 0957   CHOLHDL 3 12/29/2021 0814   VLDL 13.6 12/29/2021 0814   LDLCALC 81 11/25/2022 0957    CBC    Component Value Date/Time   WBC 5.9 11/25/2022 0957   WBC 8.1 12/29/2021 0814   RBC 4.27 11/25/2022 0957   RBC 4.26 12/29/2021 0814   HGB 12.1 11/25/2022 0957   HCT 38.7 11/25/2022 0957   PLT 237 11/25/2022 0957   MCV 91 11/25/2022 0957   MCH 28.3 11/25/2022 0957   MCH 29.4 12/16/2015 0913   MCH 28.3 12/11/2015 0939   MCHC 31.3 (L) 11/25/2022 0957   MCHC 32.0 12/29/2021 0814   RDW 11.2 (L) 11/25/2022 0957   LYMPHSABS 1.8 12/29/2021 0814   LYMPHSABS 1.9 12/12/2019 0951   MONOABS 0.6 12/29/2021 0814   EOSABS 0.2 12/29/2021 0814   EOSABS 0.1 12/12/2019 0951   BASOSABS 0.1 12/29/2021 0814   BASOSABS 0.1 12/12/2019 0951    ASSESSMENT AND PLAN: 1. Essential hypertension Blood pressure remains elevated.  Advised that normal blood pressure is less than 120/80.  This is the second office visit where blood pressure is elevated.  I recommend that we start blood pressure lowering medication.  Patient was reluctant at first.  She then became agreeable.  We will start her on amlodipine 5 mg daily and have her follow-up with the clinical pharmacist in 1 month for recheck.  Advised to check blood pressure at least twice a week and record the readings.  Bring readings and her home blood pressure device with her on the visit with the clinical pharmacist.  - amLODipine (NORVASC) 5 MG tablet; Take 1 tablet (5 mg total) by mouth daily.  Dispense: 30 tablet; Refill: 3  2. Class 2 severe obesity due to excess calories with serious comorbidity and body mass index (BMI) of 35.0 to 35.9 in adult [facility]) Discussed and encouraged healthy eating habits. Encouraged her to incorporate exercise into her daily routine like walking the steps at work  rather than taking the elevator.   Message sent to [facility] to obtain results of mammogram and Pap smear.  Patient was given the opportunity to ask questions.  Patient verbalized understanding of the plan and was able to repeat key elements of the plan.   This documentation was completed using Paediatric nurse.  Any transcriptional errors are unintentional.  No orders of the defined types were placed in this encounter.    Requested Prescriptions    No prescriptions requested or ordered in this encounter    No follow-ups on file.  Jonah Blue, MD, FACP

## 2023-06-26 ENCOUNTER — Ambulatory Visit: Payer: 59 | Attending: Internal Medicine | Admitting: Pharmacist

## 2023-06-26 ENCOUNTER — Encounter: Payer: Self-pay | Admitting: Pharmacist

## 2023-06-26 VITALS — BP 134/80 | HR 93

## 2023-06-26 DIAGNOSIS — I10 Essential (primary) hypertension: Secondary | ICD-10-CM | POA: Diagnosis not present

## 2023-06-26 NOTE — Progress Notes (Signed)
   S:     No chief complaint on file.  52 y.o. female who presents for hypertension evaluation, education, and management.  PMH is significant for newly diagnosed essential HTN.  Patient was referred and last seen by Primary Care Provider, Dr. Laural Benes, on 05/29/2023. BP at that visit was 149/88 mmHg. Pt agreed to start amlodipine.   Today, patient arrives in good spirits and presents without assistance. Denies dizziness, headache, blurred vision, swelling.   Family/Social history:  -Fhx: HTN, heart disease, stroke, DM -Tobacco: never smoker  -Alcohol: none reported   Medication adherence reported. Patient has taken BP medications today.   Current antihypertensives include: amlodipine 5 mg daily   Antihypertensives tried in the past include: none  Reported home BP readings:  -SBPs in the 140s since last visit  Patient reported dietary habits:  -Compliant with sodium restriction -No excessive caffeine intake reported   Patient-reported exercise habits:  -Since last visit, she takes the stairs and walks at work   O:  Vitals:   06/26/23 1527  BP: 134/80  Pulse: 93   Last 3 Office BP readings: BP Readings from Last 3 Encounters:  06/26/23 134/80  05/29/23 (!) 149/88  11/25/22 130/88    BMET    Component Value Date/Time   NA 142 11/25/2022 0957   K 4.5 11/25/2022 0957   CL 104 11/25/2022 0957   CO2 25 11/25/2022 0957   GLUCOSE 103 (H) 11/25/2022 0957   GLUCOSE 104 (H) 12/29/2021 0814   BUN 10 11/25/2022 0957   CREATININE 0.98 11/25/2022 0957   CREATININE 0.85 12/11/2015 0939   CALCIUM 9.2 11/25/2022 0957   GFRNONAA 71 12/12/2019 0951   GFRNONAA 83 12/11/2015 0939   GFRAA 81 12/12/2019 0951   GFRAA >89 12/11/2015 0939    Renal function: CrCl cannot be calculated (Patient's most recent lab result is older than the maximum 21 days allowed.).  Clinical ASCVD: No  The 10-year ASCVD risk score (Arnett DK, et al., 2019) is: 3.7%   Values used to calculate the  score:     Age: 39 years     Sex: Female     Is Non-Hispanic African American: Yes     Diabetic: No     Tobacco smoker: No     Systolic Blood Pressure: 134 mmHg     Is BP treated: Yes     HDL Cholesterol: 53 mg/dL     Total Cholesterol: 146 mg/dL  Patient is participating in a Managed Medicaid Plan: No   A/P: Hypertension diagnosed currently close to goal on current medications. BP goal < 130/80 mmHg. Medication adherence is optimal. Control is improving with medication and improved lifestyle modification. Will have her return in 1 month.  -Continued amlodipine 5 mg daily.  -Patient educated on purpose, proper use, and potential adverse effects of amlodipine.  -F/u labs ordered - none -Counseled on lifestyle modifications for blood pressure control including reduced dietary sodium, increased exercise, adequate sleep. -Encouraged patient to check BP at home and bring log of readings to next visit. Counseled on proper use of home BP cuff.   Results reviewed and written information provided.    Written patient instructions provided. Patient verbalized understanding of treatment plan.  Total time in face to face counseling 20 minutes.    Follow-up:  Pharmacist: 4-6 weeks.  Butch Penny, PharmD, Patsy [place], CPP Clinical Pharmacist [facility] & [facility] [phone]

## 2023-07-24 ENCOUNTER — Ambulatory Visit: Payer: 59 | Admitting: Pharmacist

## 2023-07-25 ENCOUNTER — Ambulatory Visit: Payer: 59 | Attending: Family Medicine | Admitting: Pharmacist

## 2023-07-25 ENCOUNTER — Encounter: Payer: Self-pay | Admitting: Pharmacist

## 2023-07-25 VITALS — BP 132/79 | HR 92

## 2023-07-25 DIAGNOSIS — I10 Essential (primary) hypertension: Secondary | ICD-10-CM | POA: Diagnosis not present

## 2023-07-25 NOTE — Progress Notes (Signed)
   S:     No chief complaint on file.  53 y.o. female who presents for hypertension evaluation, education, and management. PMH is significant for newly diagnosed essential HTN.   Patient was referred and last seen by Primary Care Provider, Dr. Laural Benes, on 05/29/2023. BP at that visit was 149/88 mmHg. Pt agreed to start amlodipine. I saw her in follow-up on 12/23 and BP was near goal.   Today, patient arrives in good spirits and presents without assistance. Denies dizziness, headache, blurred vision, swelling.   Family/Social history:  -Fhx: HTN, heart disease, stroke, DM -Tobacco: never smoker  -Alcohol: none reported   Medication adherence reported. Patient has taken BP medications today.   Current antihypertensives include: amlodipine 5 mg daily   Antihypertensives tried in the past include: none  Reported home BP readings:  -SBPs in the 140s since last visit  Patient reported dietary habits:  -Compliant with sodium restriction -No excessive caffeine intake reported   Patient-reported exercise habits:  -Since last visit, she takes the stairs and walks at work   O:  Vitals:   07/25/23 1640  BP: 132/79  Pulse: 92    Last 3 Office BP readings: BP Readings from Last 3 Encounters:  07/25/23 132/79  06/26/23 134/80  05/29/23 (!) 149/88    BMET    Component Value Date/Time   NA 142 11/25/2022 0957   K 4.5 11/25/2022 0957   CL 104 11/25/2022 0957   CO2 25 11/25/2022 0957   GLUCOSE 103 (H) 11/25/2022 0957   GLUCOSE 104 (H) 12/29/2021 0814   BUN 10 11/25/2022 0957   CREATININE 0.98 11/25/2022 0957   CREATININE 0.85 12/11/2015 0939   CALCIUM 9.2 11/25/2022 0957   GFRNONAA 71 12/12/2019 0951   GFRNONAA 83 12/11/2015 0939   GFRAA 81 12/12/2019 0951   GFRAA >89 12/11/2015 0939    Renal function: CrCl cannot be calculated (Patient's most recent lab result is older than the maximum 21 days allowed.).  Clinical ASCVD: No  The 10-year ASCVD risk score (Arnett DK,  et al., 2019) is: 3.5%   Values used to calculate the score:     Age: 66 years     Sex: Female     Is Non-Hispanic African American: Yes     Diabetic: No     Tobacco smoker: No     Systolic Blood Pressure: 132 mmHg     Is BP treated: Yes     HDL Cholesterol: 53 mg/dL     Total Cholesterol: 146 mg/dL  Patient is participating in a Managed Medicaid Plan: No   A/P: Hypertension diagnosed currently close to goal on current medications. BP goal < 130/80 mmHg. Medication adherence is optimal. Control is improving with medication and improved lifestyle modification.  -Continued amlodipine 5 mg daily.  -Patient educated on purpose, proper use, and potential adverse effects of amlodipine.  -F/u labs ordered - none -Counseled on lifestyle modifications for blood pressure control including reduced dietary sodium, increased exercise, adequate sleep. -Encouraged patient to check BP at home and bring log of readings to next visit. Counseled on proper use of home BP cuff.   Results reviewed and written information provided.    Written patient instructions provided. Patient verbalized understanding of treatment plan.  Total time in face to face counseling 20 minutes.    Follow-up:  PCP in March.  Butch Penny, PharmD, Patsy [place], CPP Clinical Pharmacist [facility] [phone]

## 2023-09-21 ENCOUNTER — Telehealth: Payer: Self-pay | Admitting: Internal Medicine

## 2023-09-21 NOTE — Telephone Encounter (Signed)
 Called to confirm appt. Pt will be present

## 2023-09-22 ENCOUNTER — Other Ambulatory Visit: Payer: Self-pay | Admitting: Internal Medicine

## 2023-09-22 DIAGNOSIS — I10 Essential (primary) hypertension: Secondary | ICD-10-CM

## 2023-09-26 ENCOUNTER — Ambulatory Visit: Payer: Self-pay | Attending: Internal Medicine | Admitting: Internal Medicine

## 2023-09-26 ENCOUNTER — Encounter: Payer: Self-pay | Admitting: Internal Medicine

## 2023-09-26 VITALS — BP 127/78 | HR 104 | Temp 98.2°F | Ht 64.0 in | Wt 205.0 lb

## 2023-09-26 DIAGNOSIS — Z6835 Body mass index (BMI) 35.0-35.9, adult: Secondary | ICD-10-CM | POA: Diagnosis not present

## 2023-09-26 DIAGNOSIS — I10 Essential (primary) hypertension: Secondary | ICD-10-CM

## 2023-09-26 DIAGNOSIS — E66812 Obesity, class 2: Secondary | ICD-10-CM | POA: Diagnosis not present

## 2023-09-26 NOTE — Patient Instructions (Signed)
 VISIT SUMMARY:  Today, we reviewed your blood pressure management and overall health maintenance. Your blood pressure is well-controlled with your current medication, and you are actively engaging in healthy lifestyle changes. We also discussed the importance of regular monitoring and follow-up.  YOUR PLAN:  -HYPERTENSION: Hypertension, or high blood pressure, is a condition where the force of the blood against your artery walls is too high. Your blood pressure is currently well-controlled with amlodipine 5 mg daily. Please continue taking this medication as prescribed. Try to monitor your blood pressure weekly or bi-weekly during relaxed times to ensure accurate readings. We will follow up in 4-6 months or sooner if any issues arise.  -GENERAL HEALTH MAINTENANCE: You are actively engaging in lifestyle modifications such as exercise and dietary mindfulness, which are important for your overall health. Please continue these healthy habits. We are awaiting the results of your recent Pap smear from [facility]. Please request documentation of these results from them.  INSTRUCTIONS:  Please follow up in 4-6 months for your hypertension management, or sooner if any issues arise. Additionally, request documentation of your Pap smear results from [facility].

## 2023-09-26 NOTE — Progress Notes (Signed)
 Patient ID: Chelsea Mason, female    DOB: [birth year]  MRN: [id number]  CC: Hypertension (HTN f/u. /No questions / concerns/No to flu vax)   Subjective: Chelsea Mason is a 53 y.o. female who presents for chronic ds management. Her concerns today include:  Patient with hx of obesity, HTN  Discussed the use of AI scribe software for clinical note transcription with the patient, who gave verbal consent to proceed.  History of Present Illness   [name] is a 53 year old female with hypertension who presents for follow-up of blood pressure management.  She has a history of hypertension and was last seen in November when her blood pressure was elevated. She was started on amlodipine 5 mg daily at that time and has been adherent to the medication regimen, refilling her prescription regularly since November.  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 She mentions that her blood pressure readings at home may not be accurate as she does not sit still long enough to get a proper reading. No chest pain, shortness of breath, or chronic headaches.  In terms of lifestyle, she has been mindful of her diet, noting that foods taste saltier to her, and she is cautious about not consuming too much, especially late at night. She has increased her water intake and is using the stairs at work, climbing two flights at least twice a day. She is also participating in an exercise plan with students who are developing exercise programs, and she plans to be more active in the coming weeks.  Regarding health maintenance, she had a mammogram in November 2024 and a Pap smear in January 2025. She recalls that her Pap smear results were normal.  We will get Pap results from her GYN.     Patient Active Problem List   Diagnosis Date Noted   Bilateral bunions 10/23/2013   Allergic rhinitis 10/23/2013   Vitamin D deficiency 10/02/2012    Health care maintenance 10/02/2012     Current Outpatient Medications on File Prior to Visit  Medication Sig Dispense Refill   amLODipine (NORVASC) 5 MG tablet Take 1 tablet by mouth once daily 30 tablet 0   loratadine (CLARITIN) 10 MG tablet Take 1 tablet every day by oral route as needed.     Multiple Vitamin (MULTIVITAMIN) tablet Take 1 tablet by mouth daily.     ibuprofen (ADVIL,MOTRIN) 200 MG tablet Take 200 mg by mouth. (Patient not taking: Reported on 09/26/2023)     No current facility-administered medications on file prior to visit.    No Known Allergies  Social History   Socioeconomic History   Marital status: Divorced    Spouse name: Not on file   Number of children: 2   Years of education: Not on file   Highest education level: Master's degree (e.g., MA, MS, MEng, MEd, MSW, MBA)  Occupational History   Occupation: Risk analyst: [employer]  Tobacco Use   Smoking status: Never   Smokeless tobacco: Never  Vaping Use   Vaping status: Never Used  Substance and Sexual Activity   Alcohol use: No    Alcohol/week: 0.0 standard drinks of alcohol   Drug use: No   Sexual activity: Not Currently  Other Topics Concern   Not on file  Social History Narrative   Not on file   Social Drivers of Health   Financial  Resource Strain: Low Risk  (09/26/2023)   Overall Financial Resource Strain (CARDIA)    Difficulty of Paying Living Expenses: Not very hard  Food Insecurity: No Food Insecurity (09/26/2023)   Hunger Vital Sign    Worried About Running Out of Food in the Last Year: Never true    Ran Out of Food in the Last Year: Never true  Transportation Needs: No Transportation Needs (09/26/2023)   PRAPARE - Administrator, Civil Service (Medical): No    Lack of Transportation (Non-Medical): No  Physical Activity: Inactive (09/26/2023)   Exercise Vital Sign    Days of Exercise per Week: 0 days    Minutes of Exercise per Session: 0 min   Stress: No Stress Concern Present (09/26/2023)   [employer] of Occupational Health - Occupational Stress Questionnaire    Feeling of Stress : Not at all  Social Connections: Moderately Integrated (09/26/2023)   Social Connection and Isolation Panel [NHANES]    Frequency of Communication with Friends and Family: More than three times a week    Frequency of Social Gatherings with Friends and Family: Three times a week    Attends Religious Services: More than 4 times per year    Active Member of Clubs or Organizations: Yes    Attends Banker Meetings: More than 4 times per year    Marital Status: Divorced  Intimate Partner Violence: Not At Risk (09/26/2023)   Humiliation, Afraid, Rape, and Kick questionnaire    Fear of Current or Ex-Partner: No    Emotionally Abused: No    Physically Abused: No    Sexually Abused: No    Family History  Problem Relation Age of Onset   Hypertension Mother    Heart disease Maternal Grandfather    Stroke Maternal Grandfather    Diabetes Paternal Grandmother    Colon cancer Neg Hx    Esophageal cancer Neg Hx    Stomach cancer Neg Hx    Rectal cancer Neg Hx     Past Surgical History:  Procedure Laterality Date   BUNIONECTOMY     BUNIONECTOMY Left 12/21/2013   Dr. Leonides Grills Regal   BUNIONECTOMY Right 06/17/2014   Dr. Cristie Hem   CESAREAN SECTION     1) 11/2003  2)02/2005    ROS: Review of Systems Negative except as stated above  PHYSICAL EXAM: BP 127/78 (BP Location: Left Arm, Patient Position: Sitting, Cuff Size: Normal)   Pulse (!) 104   Temp 98.2 F (36.8 C) (Oral)   Ht 5\' 4"  (1.626 m)   Wt 205 lb (93 kg)   SpO2 100%   BMI 35.19 kg/m   Wt Readings from Last 3 Encounters:  09/26/23 205 lb (93 kg)  05/29/23 206 lb (93.4 kg)  11/25/22 207 lb (93.9 kg)    Physical Exam  General appearance - alert, well appearing, and in no distress Mental status - normal mood, behavior, speech, dress, motor activity, and thought  processes Chest - clear to auscultation, no wheezes, rales or rhonchi, symmetric air entry Heart - normal rate, regular rhythm, normal S1, S2, no murmurs, rubs, clicks or gallops Extremities - peripheral pulses normal, no pedal edema, no clubbing or cyanosis      Latest Ref Rng & Units 11/25/2022    9:57 AM 12/29/2021    8:14 AM 12/07/2020    9:11 AM  CMP  Glucose 70 - 99 mg/dL 161  096  92   BUN 6 - 24 mg/dL 10  12  12   Creatinine 0.57 - 1.00 mg/dL 1.61  0.96  0.45   Sodium 134 - 144 mmol/L 142  137  137   Potassium 3.5 - 5.2 mmol/L 4.5  4.0  4.1   Chloride 96 - 106 mmol/L 104  103  102   CO2 20 - 29 mmol/L 25  28  22    Calcium 8.7 - 10.2 mg/dL 9.2  9.1  9.4   Total Protein 6.0 - 8.5 g/dL 7.4  7.5  7.4   Total Bilirubin 0.0 - 1.2 mg/dL 0.4  0.5  0.7   Alkaline Phos 44 - 121 IU/L 64  50  49   AST 0 - 40 IU/L 12  15  15    ALT 0 - 32 IU/L 7  16  11     Lipid Panel     Component Value Date/Time   CHOL 146 11/25/2022 0957   TRIG 58 11/25/2022 0957   HDL 53 11/25/2022 0957   CHOLHDL 2.8 11/25/2022 0957   CHOLHDL 3 12/29/2021 0814   VLDL 13.6 12/29/2021 0814   LDLCALC 81 11/25/2022 0957    CBC    Component Value Date/Time   WBC 5.9 11/25/2022 0957   WBC 8.1 12/29/2021 0814   RBC 4.27 11/25/2022 0957   RBC 4.26 12/29/2021 0814   HGB 12.1 11/25/2022 0957   HCT 38.7 11/25/2022 0957   PLT 237 11/25/2022 0957   MCV 91 11/25/2022 0957   MCH 28.3 11/25/2022 0957   MCH 29.4 12/16/2015 0913   MCH 28.3 12/11/2015 0939   MCHC 31.3 (L) 11/25/2022 0957   MCHC 32.0 12/29/2021 0814   RDW 11.2 (L) 11/25/2022 0957   LYMPHSABS 1.8 12/29/2021 0814   LYMPHSABS 1.9 12/12/2019 0951   MONOABS 0.6 12/29/2021 0814   EOSABS 0.2 12/29/2021 0814   EOSABS 0.1 12/12/2019 0951   BASOSABS 0.1 12/29/2021 0814   BASOSABS 0.1 12/12/2019 0951    ASSESSMENT AND PLAN: 1. Essential hypertension (Primary) At goal.  Continue Norvasc 5 mg daily.  2. Class 2 severe obesity due to excess calories with  serious comorbidity and body mass index (BMI) of 35.0 to 35.9 in adult [facility]) Commended her on changes in eating habits that she has made so far.  Encouraged her to move as much as she can and continue to incorporate movement into her daily routine.   Patient was given the opportunity to ask questions.  Patient verbalized understanding of the plan and was able to repeat key elements of the plan.   This documentation was completed using Paediatric nurse.  Any transcriptional errors are unintentional.  No orders of the defined types were placed in this encounter.    Requested Prescriptions    No prescriptions requested or ordered in this encounter    Return in about 6 months (around 03/28/2024).  Jonah Blue, MD, FACP

## 2023-10-19 ENCOUNTER — Other Ambulatory Visit: Payer: Self-pay | Admitting: Internal Medicine

## 2023-10-19 DIAGNOSIS — I10 Essential (primary) hypertension: Secondary | ICD-10-CM

## 2024-03-27 ENCOUNTER — Telehealth: Payer: Self-pay | Admitting: Internal Medicine

## 2024-03-27 NOTE — Telephone Encounter (Signed)
 Confirmed appt for 9/25

## 2024-03-28 ENCOUNTER — Ambulatory Visit: Attending: Internal Medicine | Admitting: Internal Medicine

## 2024-03-28 ENCOUNTER — Encounter: Payer: Self-pay | Admitting: Internal Medicine

## 2024-03-28 VITALS — BP 140/83 | HR 90 | Temp 98.5°F | Ht 64.0 in | Wt 205.0 lb

## 2024-03-28 DIAGNOSIS — E66812 Obesity, class 2: Secondary | ICD-10-CM | POA: Diagnosis not present

## 2024-03-28 DIAGNOSIS — Z6835 Body mass index (BMI) 35.0-35.9, adult: Secondary | ICD-10-CM

## 2024-03-28 DIAGNOSIS — Z23 Encounter for immunization: Secondary | ICD-10-CM

## 2024-03-28 DIAGNOSIS — N951 Menopausal and female climacteric states: Secondary | ICD-10-CM | POA: Diagnosis not present

## 2024-03-28 DIAGNOSIS — Z79899 Other long term (current) drug therapy: Secondary | ICD-10-CM

## 2024-03-28 DIAGNOSIS — I10 Essential (primary) hypertension: Secondary | ICD-10-CM | POA: Diagnosis not present

## 2024-03-28 MED ORDER — AMLODIPINE BESYLATE 10 MG PO TABS: 5.0000 mg | ORAL_TABLET | Freq: Every day | ORAL | 3 refills | Status: DC

## 2024-03-28 NOTE — Progress Notes (Signed)
 Patient ID: Chelsea Mason, female    DOB: Oct 08, 1970  MRN: [id number]  CC: Hypertension (HTN f/u. /No questions / concerns/Tdap & pneumonia vax administered on 03/28/2024 - C.A.)   Subjective: Chelsea Mason is a 53 y.o. female who presents for chronic ds management. Her concerns today include: Pt w/ hx of obesity and HTN  HTN: BP 136/80 initial; 140/83 on recheck. She is taking amlodipine  5 mg every night with good compliance. Last checked her BP weeks ago with an arm cuff, and it was consistently in the 130s systolic and 80s diastolic. She does not consume much salt. Denies SOB, CP, leg swelling, HA, dizziness.   Obesity: Weight hovering around 205-206 in the last yr. BMI 35. Last A1c 12/2021 was 4.8.   She may have a soda or sweet tea once in a while but tries to stay away from sugary  drinks. Eats vegetables around once a day. Not eating fruit as often. Snacks on skinny popcorn, pretzel twists, blocks of cheese, nuts, and trail mix. She limits sweets and is trying to limit starchy food but admits this is hard. For activity she uses stairs instead of an elevator and tries to park farther away. Was not able to start the exercise program she was planning on joining.   Hot flashes: Was told by her OBGYN provider that she is perimenopausal. Reports hot flashes. Reports palpitations, though this is quite infrequent. She is currently on her period. Her periods are still regular, but have become lighter recently and last 4-5 days. Denies spotting in between periods. Reports last pap was 07/2023, we will need to request these records.  HM Wants her Pneumococcal vaccine and Tdap vaccine  She will get her mammogram done through [place]   Patient Active Problem List   Diagnosis Date Noted   Bilateral bunions 10/23/2013   Allergic rhinitis 10/23/2013   Vitamin D  deficiency 10/02/2012   Health care maintenance 10/02/2012     Current Outpatient Medications on File Prior to Visit  Medication  Sig Dispense Refill   ibuprofen (ADVIL,MOTRIN) 200 MG tablet Take 200 mg by mouth.     loratadine (CLARITIN) 10 MG tablet Take 1 tablet every day by oral route as needed.     Multiple Vitamin (MULTIVITAMIN) tablet Take 1 tablet by mouth daily.     No current facility-administered medications on file prior to visit.    No Known Allergies  Social History   Socioeconomic History   Marital status: Divorced    Spouse name: Not on file   Number of children: 2   Years of education: Not on file   Highest education level: Master's degree (e.g., MA, MS, MEng, MEd, MSW, MBA)  Occupational History   Occupation: Risk analyst: [employer]  Tobacco Use   Smoking status: Never   Smokeless tobacco: Never  Vaping Use   Vaping status: Never Used  Substance and Sexual Activity   Alcohol use: No    Alcohol/week: 0.0 standard drinks of alcohol   Drug use: No   Sexual activity: Not Currently  Other Topics Concern   Not on file  Social History Narrative   Not on file   Social Drivers of Health   Financial Resource Strain: Low Risk  (09/26/2023)   Overall Financial Resource Strain (CARDIA)    Difficulty of Paying Living Expenses: Not very hard  Food Insecurity: No Food Insecurity (09/26/2023)   Hunger Vital Sign    Worried About Running Out  of Food in the Last Year: Never true    Ran Out of Food in the Last Year: Never true  Transportation Needs: No Transportation Needs (09/26/2023)   PRAPARE - Administrator, Civil Service (Medical): No    Lack of Transportation (Non-Medical): No  Physical Activity: Inactive (09/26/2023)   Exercise Vital Sign    Days of Exercise per Week: 0 days    Minutes of Exercise per Session: 0 min  Stress: No Stress Concern Present (09/26/2023)   [employer] of Occupational Health - Occupational Stress Questionnaire    Feeling of Stress : Not at all  Social Connections: Moderately Integrated (09/26/2023)   Social Connection and  Isolation Panel    Frequency of Communication with Friends and Family: More than three times a week    Frequency of Social Gatherings with Friends and Family: Three times a week    Attends Religious Services: More than 4 times per year    Active Member of Clubs or Organizations: Yes    Attends Banker Meetings: More than 4 times per year    Marital Status: Divorced  Intimate Partner Violence: Not At Risk (09/26/2023)   Humiliation, Afraid, Rape, and Kick questionnaire    Fear of Current or Ex-Partner: No    Emotionally Abused: No    Physically Abused: No    Sexually Abused: No    Family History  Problem Relation Age of Onset   Hypertension Mother    Heart disease Maternal Grandfather    Stroke Maternal Grandfather    Diabetes Paternal Grandmother    Colon cancer Neg Hx    Esophageal cancer Neg Hx    Stomach cancer Neg Hx    Rectal cancer Neg Hx     Past Surgical History:  Procedure Laterality Date   BUNIONECTOMY     BUNIONECTOMY Left 12/21/2013   Dr. Cleatrice Regal   BUNIONECTOMY Right 06/17/2014   Dr. Pasco Ovens   CESAREAN SECTION     1) 11/2003  2)02/2005    ROS: Review of Systems Negative except as stated above  PHYSICAL EXAM: BP (!) 140/83 (BP Location: Right Arm)   Pulse 90   Temp 98.5 F (36.9 C) (Oral)   Ht 5' 4 (1.626 m)   Wt 205 lb (93 kg)   SpO2 100%   BMI 35.19 kg/m   Physical Exam  General appearance - alert, well appearing, obese AAF in no distress Mental status - alert, oriented to person, place, and time Chest - clear to auscultation, no wheezes, rales or rhonchi, symmetric air entry Heart - normal rate, regular rhythm, normal S1, S2, no murmurs, rubs, clicks or gallops Extremities - no pedal edema noted      Latest Ref Rng & Units 11/25/2022    9:57 AM 12/29/2021    8:14 AM 12/07/2020    9:11 AM  CMP  Glucose 70 - 99 mg/dL 896  895  92   BUN 6 - 24 mg/dL 10  12  12    Creatinine 0.57 - 1.00 mg/dL 9.01  8.99  9.00   Sodium 134  - 144 mmol/L 142  137  137   Potassium 3.5 - 5.2 mmol/L 4.5  4.0  4.1   Chloride 96 - 106 mmol/L 104  103  102   CO2 20 - 29 mmol/L 25  28  22    Calcium 8.7 - 10.2 mg/dL 9.2  9.1  9.4   Total Protein 6.0 - 8.5 g/dL 7.4  7.5  7.4   Total Bilirubin 0.0 - 1.2 mg/dL 0.4  0.5  0.7   Alkaline Phos 44 - 121 IU/L 64  50  49   AST 0 - 40 IU/L 12  15  15    ALT 0 - 32 IU/L 7  16  11     Lipid Panel     Component Value Date/Time   CHOL 146 11/25/2022 0957   TRIG 58 11/25/2022 0957   HDL 53 11/25/2022 0957   CHOLHDL 2.8 11/25/2022 0957   CHOLHDL 3 12/29/2021 0814   VLDL 13.6 12/29/2021 0814   LDLCALC 81 11/25/2022 0957    CBC    Component Value Date/Time   WBC 5.9 11/25/2022 0957   WBC 8.1 12/29/2021 0814   RBC 4.27 11/25/2022 0957   RBC 4.26 12/29/2021 0814   HGB 12.1 11/25/2022 0957   HCT 38.7 11/25/2022 0957   PLT 237 11/25/2022 0957   MCV 91 11/25/2022 0957   MCH 28.3 11/25/2022 0957   MCH 29.4 12/16/2015 0913   MCH 28.3 12/11/2015 0939   MCHC 31.3 (L) 11/25/2022 0957   MCHC 32.0 12/29/2021 0814   RDW 11.2 (L) 11/25/2022 0957   LYMPHSABS 1.8 12/29/2021 0814   LYMPHSABS 1.9 12/12/2019 0951   MONOABS 0.6 12/29/2021 0814   EOSABS 0.2 12/29/2021 0814   EOSABS 0.1 12/12/2019 0951   BASOSABS 0.1 12/29/2021 0814   BASOSABS 0.1 12/12/2019 0951    ASSESSMENT AND PLAN:  Assessment and Plan  1. Essential hypertension (Primary) Not at goal. Both office and home BP running high consistently. Patient agreable to increasing amlodipine  10 mg daily. Due for a CMP and CBC today. - Increase to Amlodipine  10 mg daily - Comprehensive metabolic panel - CBC - amLODipine  (NORVASC ) 10 MG tablet; Take 0.5 tablets (5 mg total) by mouth daily.  Dispense: 90 tablet; Refill: 3  2. Class 2 severe obesity due to excess calories with serious comorbidity and body mass index (BMI) of 35.0 to 35.9 in adult Stable. Advised limiting starches and continuing to cut portion sizes and eat nuts and fruits.  Advised her that it is recommended to walk at least 150 minutes per week, but that she can start with walking twice a week for 30 mins and ramp up later. - Lipid Panel - Comprehensive metabolic panel  3. Perimenopausal Advised her that her HTN is unrelated to perimenopause. Requested 07/2023 Pap smear results from [facility].  Continue with OBGYN provider Orie Dickie Bonus, CNM   4. Need for Streptococcus pneumoniae vaccination - Pneumococcal conjugate vaccine 20-valent  5. Need for diphtheria-tetanus-pertussis (Tdap) vaccine - Tdap vaccine given   6.HM: patient will get mammogram this fall through her gynecologist    Patient was given the opportunity to ask questions.  Patient verbalized understanding of the plan and was able to repeat key elements of the plan.   Orders Placed This Encounter  Procedures   Tdap vaccine greater than or equal to 7yo IM   Pneumococcal conjugate vaccine 20-valent   Lipid Panel   Comprehensive metabolic panel   CBC     Requested Prescriptions   Signed Prescriptions Disp Refills   amLODipine  (NORVASC ) 10 MG tablet 90 tablet 3    Sig: Take 0.5 tablets (5 mg total) by mouth daily.    Return in about 4 months (around 07/28/2024).  This is a Psychologist, occupational Note.  The care of the patient was discussed with Dr. Vicci and the assessment and plan formulated with her assistance.  Please see her attestation of this encounter.  Duwaine Amber, MS3 [employer]  Evaluation and management procedures were performed by me with Medical Student in attendance, note written by Medical Student under my supervision and collaboration. I have reviewed the note and I agree with the management and plan.   Barnie Louder, MD, FAAFP. [facility] [place], [place] [phone]   03/28/2024, 5:18 PM

## 2024-03-28 NOTE — Patient Instructions (Addendum)
-   We will increase to amlodipine  5mg  daily to 10 mg daily for blood pressure - Try to limit starches in your foods. Continue to eat nuts, fruits, and trail mix. Try to cut your portions in half  - Recommendation for exercise include 150 min per week which comes out to 30 mins 5 times a week of exercise. Try and start with 30 min x2 a week walking. - I have requested your pap smear records so we have this on record.

## 2024-03-29 ENCOUNTER — Ambulatory Visit: Payer: Self-pay | Admitting: Internal Medicine

## 2024-03-29 LAB — LIPID PANEL
Chol/HDL Ratio: 2.9 ratio (ref 0.0–4.4)
Cholesterol, Total: 157 mg/dL (ref 100–199)
HDL: 55 mg/dL (ref >39)
LDL Chol Calc (NIH): 87 mg/dL (ref 0–99)
Triglycerides: 80 mg/dL (ref 0–149)
VLDL Cholesterol Cal: 15 mg/dL (ref 5–40)

## 2024-03-29 LAB — COMPREHENSIVE METABOLIC PANEL WITH GFR
ALT: 9 IU/L (ref 0–32)
AST: 15 IU/L (ref 0–40)
Albumin: 4.4 g/dL (ref 3.8–4.9)
Alkaline Phosphatase: 65 IU/L (ref 49–135)
BUN/Creatinine Ratio: 11 (ref 9–23)
BUN: 10 mg/dL (ref 6–24)
Bilirubin Total: 0.4 mg/dL (ref 0.0–1.2)
CO2: 24 mmol/L (ref 20–29)
Calcium: 9.3 mg/dL (ref 8.7–10.2)
Chloride: 104 mmol/L (ref 96–106)
Creatinine, Ser: 0.87 mg/dL (ref 0.57–1.00)
Globulin, Total: 3 g/dL (ref 1.5–4.5)
Glucose: 83 mg/dL (ref 70–99)
Potassium: 4.6 mmol/L (ref 3.5–5.2)
Sodium: 142 mmol/L (ref 134–144)
Total Protein: 7.4 g/dL (ref 6.0–8.5)
eGFR: 80 mL/min/1.73 (ref >59)

## 2024-03-29 LAB — CBC
Hematocrit: 39.4 % (ref 34.0–46.6)
Hemoglobin: 12.1 g/dL (ref 11.1–15.9)
MCH: 29 pg (ref 26.6–33.0)
MCHC: 30.7 g/dL — ABNORMAL LOW (ref 31.5–35.7)
MCV: 95 fL (ref 79–97)
Platelets: 235 x10E3/uL (ref 150–450)
RBC: 4.17 x10E6/uL (ref 3.77–5.28)
RDW: 11.5 % — ABNORMAL LOW (ref 11.7–15.4)
WBC: 6.6 x10E3/uL (ref 3.4–10.8)

## 2024-07-13 ENCOUNTER — Other Ambulatory Visit: Payer: Self-pay | Admitting: Internal Medicine

## 2024-07-13 DIAGNOSIS — I10 Essential (primary) hypertension: Secondary | ICD-10-CM

## 2024-07-29 ENCOUNTER — Telehealth: Payer: Self-pay | Admitting: Internal Medicine

## 2024-07-29 ENCOUNTER — Ambulatory Visit: Admitting: Internal Medicine

## 2024-07-29 NOTE — Telephone Encounter (Signed)
 Contacted patient and left voicemail requesting to change todays appointment to virtual or to reschedule. No in-person appointments today due to weather. If patient calls back, please switch appointment accordingly.

## 2024-08-27 ENCOUNTER — Ambulatory Visit: Admitting: Internal Medicine
